# Patient Record
Sex: Female | Born: 1935 | Race: White | Hispanic: No | Smoking: Never smoker
Health system: Southern US, Community
[De-identification: ages and names within clinical notes are randomized; demographics above are authoritative.]

## PROBLEM LIST (undated history)

## (undated) DIAGNOSIS — I959 Hypotension, unspecified: Secondary | ICD-10-CM

## (undated) DIAGNOSIS — F0280 Dementia in other diseases classified elsewhere without behavioral disturbance: Secondary | ICD-10-CM

## (undated) DIAGNOSIS — K259 Gastric ulcer, unspecified as acute or chronic, without hemorrhage or perforation: Secondary | ICD-10-CM

## (undated) DIAGNOSIS — K5900 Constipation, unspecified: Secondary | ICD-10-CM

## (undated) DIAGNOSIS — M810 Age-related osteoporosis without current pathological fracture: Secondary | ICD-10-CM

## (undated) DIAGNOSIS — G309 Alzheimer's disease, unspecified: Secondary | ICD-10-CM

## (undated) DIAGNOSIS — K644 Residual hemorrhoidal skin tags: Secondary | ICD-10-CM

## (undated) DIAGNOSIS — M316 Other giant cell arteritis: Secondary | ICD-10-CM

## (undated) HISTORY — DX: Other giant cell arteritis: M31.6

## (undated) HISTORY — DX: Dementia in other diseases classified elsewhere, unspecified severity, without behavioral disturbance, psychotic disturbance, mood disturbance, and anxiety: F02.80

## (undated) HISTORY — DX: Hypotension, unspecified: I95.9

## (undated) HISTORY — DX: Age-related osteoporosis without current pathological fracture: M81.0

## (undated) HISTORY — DX: Residual hemorrhoidal skin tags: K64.4

## (undated) HISTORY — DX: Gastric ulcer, unspecified as acute or chronic, without hemorrhage or perforation: K25.9

## (undated) HISTORY — DX: Constipation, unspecified: K59.00

## (undated) HISTORY — DX: Alzheimer's disease, unspecified: G30.9

---

## 1999-01-21 ENCOUNTER — Encounter (INDEPENDENT_AMBULATORY_CARE_PROVIDER_SITE_OTHER): Payer: Self-pay | Admitting: *Deleted

## 1999-01-21 ENCOUNTER — Ambulatory Visit (HOSPITAL_COMMUNITY): Admission: RE | Admit: 1999-01-21 | Discharge: 1999-01-21 | Payer: Self-pay | Admitting: Gastroenterology

## 2001-10-04 ENCOUNTER — Ambulatory Visit (HOSPITAL_COMMUNITY): Admission: RE | Admit: 2001-10-04 | Discharge: 2001-10-04 | Payer: Self-pay | Admitting: Emergency Medicine

## 2002-06-07 ENCOUNTER — Ambulatory Visit (HOSPITAL_COMMUNITY): Admission: RE | Admit: 2002-06-07 | Discharge: 2002-06-07 | Payer: Self-pay

## 2004-03-05 ENCOUNTER — Encounter: Admission: RE | Admit: 2004-03-05 | Discharge: 2004-03-05 | Payer: Self-pay | Admitting: Gastroenterology

## 2005-02-17 ENCOUNTER — Encounter: Admission: RE | Admit: 2005-02-17 | Discharge: 2005-02-17 | Payer: Self-pay | Admitting: Emergency Medicine

## 2005-02-24 ENCOUNTER — Encounter: Admission: RE | Admit: 2005-02-24 | Discharge: 2005-02-24 | Payer: Self-pay | Admitting: Emergency Medicine

## 2005-05-21 ENCOUNTER — Encounter: Payer: Self-pay | Admitting: *Deleted

## 2005-06-24 ENCOUNTER — Encounter: Payer: Self-pay | Admitting: Vascular Surgery

## 2005-06-24 ENCOUNTER — Ambulatory Visit (HOSPITAL_COMMUNITY): Admission: RE | Admit: 2005-06-24 | Discharge: 2005-06-24 | Payer: Self-pay | Admitting: Emergency Medicine

## 2006-02-23 ENCOUNTER — Emergency Department (HOSPITAL_COMMUNITY): Admission: EM | Admit: 2006-02-23 | Discharge: 2006-02-23 | Payer: Self-pay | Admitting: Emergency Medicine

## 2006-03-15 ENCOUNTER — Encounter: Admission: RE | Admit: 2006-03-15 | Discharge: 2006-04-10 | Payer: Self-pay | Admitting: Psychiatry

## 2006-04-11 ENCOUNTER — Encounter: Admission: RE | Admit: 2006-04-11 | Discharge: 2006-05-12 | Payer: Self-pay | Admitting: Psychiatry

## 2006-05-16 ENCOUNTER — Encounter: Admission: RE | Admit: 2006-05-16 | Discharge: 2006-08-14 | Payer: Self-pay | Admitting: Psychiatry

## 2008-11-06 ENCOUNTER — Encounter: Payer: Self-pay | Admitting: Emergency Medicine

## 2008-11-07 ENCOUNTER — Inpatient Hospital Stay (HOSPITAL_COMMUNITY): Admission: RE | Admit: 2008-11-07 | Discharge: 2008-11-09 | Payer: Self-pay | Admitting: Orthopedic Surgery

## 2010-03-07 ENCOUNTER — Encounter: Payer: Self-pay | Admitting: Gastroenterology

## 2010-05-22 LAB — BASIC METABOLIC PANEL
BUN: 17 mg/dL (ref 6–23)
BUN: 19 mg/dL (ref 6–23)
CO2: 25 mEq/L (ref 19–32)
Chloride: 101 mEq/L (ref 96–112)
Chloride: 103 mEq/L (ref 96–112)
Creatinine, Ser: 0.73 mg/dL (ref 0.4–1.2)
GFR calc non Af Amer: >60 mL/min (ref >60)
Glucose, Bld: 144 mg/dL — ABNORMAL HIGH (ref 70–99)
Potassium: 3.7 mEq/L (ref 3.5–5.1)
Potassium: 3.9 mEq/L (ref 3.5–5.1)
Sodium: 139 mEq/L (ref 135–145)

## 2010-05-22 LAB — CBC
HCT: 27.5 % — ABNORMAL LOW (ref 36.0–46.0)
HCT: 28.2 % — ABNORMAL LOW (ref 36.0–46.0)
Hemoglobin: 9.4 g/dL — ABNORMAL LOW (ref 12.0–15.0)
Hemoglobin: 9.7 g/dL — ABNORMAL LOW (ref 12.0–15.0)
MCHC: 34.5 g/dL (ref 30.0–36.0)
MCV: 93 fL (ref 78.0–100.0)
MCV: 91.8 fL (ref 78.0–100.0)
Platelets: 129 10*3/uL — ABNORMAL LOW (ref 150–400)
Platelets: 138 10*3/uL — ABNORMAL LOW (ref 150–400)
Platelets: 176 10*3/uL (ref 150–400)
RDW: 13.1 % (ref 11.5–15.5)
WBC: 7.7 10*3/uL (ref 4.0–10.5)
WBC: 7.7 10*3/uL (ref 4.0–10.5)
WBC: 9.1 10*3/uL (ref 4.0–10.5)

## 2010-05-22 LAB — URINALYSIS, ROUTINE W REFLEX MICROSCOPIC
Glucose, UA: NEGATIVE mg/dL
Hgb urine dipstick: NEGATIVE
Ketones, ur: NEGATIVE mg/dL
Protein, ur: NEGATIVE mg/dL

## 2010-05-22 LAB — URINE MICROSCOPIC-ADD ON

## 2010-05-22 LAB — PROTIME-INR: Prothrombin Time: 13.9 seconds (ref 11.6–15.2)

## 2010-07-03 NOTE — Op Note (Signed)
   Norma Daugherty, Norma Daugherty                      ACCOUNT NO.:  [id number]   MEDICAL RECORD NO.:  [id number]                   PATIENT TYPE:  AMB   LOCATION:  ENDO                                 FACILITY:  [facility]   PHYSICIAN:  James L. Malon Kindle., M.D.          DATE OF BIRTH:  Jan 16, 1926   DATE OF PROCEDURE:  06/07/2002  DATE OF DISCHARGE:                                 OPERATIVE REPORT   PROCEDURE:  Esophagogastroduodenoscopy and biopsy.   MEDICATIONS:  Cetacaine spray, fentanyl 90 mcg, Versed 9 mg IV.   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   DESCRIPTION OF PROCEDURE:  The procedure had been explained to the patient  and consent obtained.  With the patient in the left lateral decubitus  position, the Olympus scope was inserted and advanced under direct  visualization.  Stomach entered, pylorus identified and passed.  The  duodenum, including the bulb and second portion, were seen well and were  unremarkable.  The scope was withdrawn back into the stomach.  No polyps  were seen in the duodenum.  The pyloric channel and the antrum were normal  other than streaky gastritis.  This was biopsied for rapid urease test for  Helicobacter.  No ulcerations were seen.  Fundus and cardia seen in the  retroflex view.  There was a small hiatal hernia.  The distal and proximal  esophagus were endoscopically normal.  The scope withdrawn.  The patient  tolerated the procedure well.   ASSESSMENT:  Mild gastritis, otherwise no abnormalities.  No polyps seen in  the duodenum.   PLAN:  Will proceed with colonoscopy at this time.                                               James L. Malon Kindle., M.D.    Waldron Session  D:  06/07/2002  T:  06/07/2002  Job:  [id number]   cc:   Oley Balm. Georgina Pillion, M.D.  [address]  [place]  [place] [address]  Fax: [phone]

## 2010-07-03 NOTE — Op Note (Signed)
[place]  Patient:    Norma Daugherty                    MRN: [id number] Proc. Date: 01/21/99 Adm. Date:  [id number] Attending:  Orland Mustard CC:         Oley Balm. Georgina Pillion, M.D.                           Operative Report  PROCEDURE:  Esophagogastroduodenoscopy, polypectomy and injection of polypectomy site.  MEDICATIONS: 1. Hurricane spray. 2. Fentanyl 100 mcg IV. 3. Versed 10 mg IV.  INDICATION:  Heme-positive stool.  DESCRIPTION OF PROCEDURE:  The procedure being explained to patient, consent obtained.  With the patient in the left lateral decubitus position, the Olympus  video endoscope was inserted blindly into the esophagus, advanced under direct visualization.  The stomach was entered, pylorus was identified on the pass. The duodenum was entered; immediately upon entering the duodenum there was a moderate amount of bright red blood.  There is no alteration seen in the bulb.  The blood seemed to be coming from the second portion.  We advanced the scope down into the second portion and a massive polyp (which was quite soft) was seen essentially filling up the lumen.  It was elongated and was ulcerated and actively bleeding.  I grabbed the polyp with a snare and pulled it up. This was quite soft, had a pillow sound like a lipoma.  The ulcerations on the head of the polyp and along the body were dripping blood.  The area was vigorously irrigated and at this point I considered, given the massive size of the polyp, backing off and having her go o surgery for removal.  Since it was actively bleeding, I felt something needed to be done.  After examining the stalk it appeared that the stalk was small, came up nto the duodenal bulb.  The whole area was irrigated and there was still bleeding seen at the tip of the polyp.  At this point I made the decision to go ahead and attempt to remove this endoscopically.  If there was  bleeding or failure to remove it endoscopically then surgery would be necessary.  But, it was possible that we could remove it adequately endoscopically.  The base of the polyp was injected with 1 cc of 1:10,000 epinephrine, with control of bleeding.  The giant snare was used and after some time we were finally able to get it around the head of polyp and pulled back to the base.  It was transected  very easily.  There was no bleeding seen at the transection site.  The polyp fortunately went distal down into the duodenum beyond the extent of the endoscope. At this point I removed the endoscope.  We reinserted a pediatric colonoscope, ent back to approximately 60 to 80 cm and found the polyp down in the third and fourth duodenum.  The snare was placed around it and we retrieved the polyp, pulled it  back into the stomach, readjusted the snare and pulled it out the mouth.  There was no bleeding at the polypectomy site.  The patient tolerated this reasonably well and was maintained on low-flow oxygen and pulse oximetry throughout the procedure, with no obvious problem.  ASSESSMENT: 1. 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 2. Bleeding controlled with epinephrine injection.  PLAN:  Will check pathology and proceed with colonoscopy at  this time, as mentioned.DD:  01/21/99 TD:  01/22/99 Job: [id number]

## 2010-07-03 NOTE — Procedures (Signed)
. [facility]  Patient:    Norma Daugherty                    MRN: [id number] Proc. Date: 01/21/99 Adm. Date:  [id number] Attending:  Orland Mustard CC:         Oley Balm. Georgina Pillion, M.D.                           Procedure Report  PROCEDURE PERFORMED:  Colonoscopy with polypectomy.  ENDOSCOPIST:  Llana Aliment. Randa Evens, M.D.  MEDICATIONS USED:  Fentanyl 100 mcg, Versed 12 mg IV.  SCOPE:  Pediatric video colonoscope.  INDICATIONS FOR PROCEDURE:  Heme positive stool.  DESCRIPTION OF PROCEDURE:  The procedure had been explained to the patient and consent obtained.  This was done following the endoscopy and removal of large lipoma from the duodenum.  The pediatric colonoscope inserted and advanced under direct visualization.  The prep was excellent.  We were able to reach the cecum  without difficulty.  The ileocecal valve was seen and entered for a short distance. The scope withdrawn, cecum, ascending colon, hepatic flexure, transverse colon ere seen well.  Two small polyps at the splenic flexure cauterized.  The descending and sigmoid colon were unremarkable.  A small rectal polyp removed.  These were placed in separate jars.  The scope was withdrawn.  The patient tolerated the procedure well.  ASSESSMENT:  Multiple colon polyps removed.  PLAN:  Will recommend repeating procedure in two years. DD:  01/21/99 TD:  01/22/99 Job: [id number]

## 2010-07-03 NOTE — Op Note (Signed)
   Norma Daugherty, Norma Daugherty                      ACCOUNT NO.:  [id number]   MEDICAL RECORD NO.:  [id number]                   PATIENT TYPE:  AMB   LOCATION:  ENDO                                 FACILITY:  [facility]   PHYSICIAN:  James L. Malon Kindle., M.D.          DATE OF BIRTH:  June 13, 1925   DATE OF PROCEDURE:  06/07/2002  DATE OF DISCHARGE:                                 OPERATIVE REPORT   PROCEDURE:  Colonoscopy.   MEDICATIONS:  __________ 100 mg, Versed 10 mg IV.   ENDOSCOPE:  Olympus pediatric colonoscope.   INDICATIONS:  Patient with a previous history of polyps.  This is done as a  three-year follow-up.   DESCRIPTION OF PROCEDURE:  The procedure had been explained to the patient  and consent obtained.  With the patient in the left lateral decubitus  position, following the endoscopy the patient was turned around and the  pediatric scope was inserted and advanced under direct visualization.  Prep  excellent, cecum reached.  The ileocecal valve and appendiceal orifice seen.  Scope withdrawn.  Cecum, ascending colon, transverse colon, descending and  sigmoid colon seen well and no significant diverticular disease, no further  polyps seen.  Rectum free of polyps.  Scope withdrawn.  The patient  tolerated the procedure well.   ASSESSMENT:  No evidence of further polyps.   PLAN:  Will recommend repeating in five years.                                               James L. Malon Kindle., M.D.    Norma Daugherty  D:  06/07/2002  T:  06/07/2002  Job:  [id number]   cc:   Oley Balm. Georgina Pillion, M.D.  [address]  [place]  [place] [address]  Fax: [phone]

## 2013-02-15 DIAGNOSIS — K644 Residual hemorrhoidal skin tags: Secondary | ICD-10-CM

## 2013-02-15 HISTORY — DX: Residual hemorrhoidal skin tags: K64.4

## 2013-05-21 ENCOUNTER — Ambulatory Visit: Payer: Medicare Other | Attending: Orthopaedic Surgery

## 2013-05-21 DIAGNOSIS — M25579 Pain in unspecified ankle and joints of unspecified foot: Secondary | ICD-10-CM | POA: Diagnosis not present

## 2013-05-21 DIAGNOSIS — IMO0001 Reserved for inherently not codable concepts without codable children: Secondary | ICD-10-CM | POA: Diagnosis present

## 2013-05-21 DIAGNOSIS — M25673 Stiffness of unspecified ankle, not elsewhere classified: Secondary | ICD-10-CM | POA: Diagnosis not present

## 2013-05-21 DIAGNOSIS — M6281 Muscle weakness (generalized): Secondary | ICD-10-CM | POA: Insufficient documentation

## 2013-05-21 DIAGNOSIS — M25676 Stiffness of unspecified foot, not elsewhere classified: Secondary | ICD-10-CM | POA: Insufficient documentation

## 2013-05-28 ENCOUNTER — Ambulatory Visit: Payer: Medicare Other | Admitting: Occupational Therapy

## 2013-05-28 DIAGNOSIS — IMO0001 Reserved for inherently not codable concepts without codable children: Secondary | ICD-10-CM | POA: Diagnosis not present

## 2013-05-30 ENCOUNTER — Ambulatory Visit: Payer: Medicare Other | Admitting: Occupational Therapy

## 2013-06-06 ENCOUNTER — Ambulatory Visit: Payer: Medicare Other | Admitting: Occupational Therapy

## 2013-06-06 DIAGNOSIS — IMO0001 Reserved for inherently not codable concepts without codable children: Secondary | ICD-10-CM | POA: Diagnosis not present

## 2013-06-08 ENCOUNTER — Ambulatory Visit: Payer: Medicare Other | Admitting: Occupational Therapy

## 2013-06-08 DIAGNOSIS — IMO0001 Reserved for inherently not codable concepts without codable children: Secondary | ICD-10-CM | POA: Diagnosis not present

## 2013-06-11 ENCOUNTER — Ambulatory Visit: Payer: Medicare Other | Admitting: Occupational Therapy

## 2013-06-11 DIAGNOSIS — IMO0001 Reserved for inherently not codable concepts without codable children: Secondary | ICD-10-CM | POA: Diagnosis not present

## 2013-06-13 ENCOUNTER — Ambulatory Visit: Payer: Medicare Other | Admitting: Occupational Therapy

## 2013-06-13 DIAGNOSIS — IMO0001 Reserved for inherently not codable concepts without codable children: Secondary | ICD-10-CM | POA: Diagnosis not present

## 2013-06-18 ENCOUNTER — Encounter: Payer: Medicare Other | Admitting: Occupational Therapy

## 2013-06-20 ENCOUNTER — Encounter: Payer: Medicare Other | Admitting: Occupational Therapy

## 2013-10-08 ENCOUNTER — Encounter (HOSPITAL_COMMUNITY): Payer: Self-pay | Admitting: Emergency Medicine

## 2013-10-08 ENCOUNTER — Emergency Department (HOSPITAL_COMMUNITY)
Admission: EM | Admit: 2013-10-08 | Discharge: 2013-10-08 | Disposition: A | Discharge status: Home / Self Care | Payer: Medicare Other | Attending: Emergency Medicine | Admitting: Emergency Medicine

## 2013-10-08 ENCOUNTER — Emergency Department (HOSPITAL_COMMUNITY): Payer: Medicare Other

## 2013-10-08 DIAGNOSIS — R404 Transient alteration of awareness: Secondary | ICD-10-CM | POA: Diagnosis present

## 2013-10-08 DIAGNOSIS — R55 Syncope and collapse: Secondary | ICD-10-CM | POA: Diagnosis not present

## 2013-10-08 DIAGNOSIS — F0280 Dementia in other diseases classified elsewhere without behavioral disturbance: Secondary | ICD-10-CM | POA: Diagnosis not present

## 2013-10-08 DIAGNOSIS — G309 Alzheimer's disease, unspecified: Secondary | ICD-10-CM | POA: Insufficient documentation

## 2013-10-08 HISTORY — DX: Dementia in other diseases classified elsewhere, unspecified severity, without behavioral disturbance, psychotic disturbance, mood disturbance, and anxiety: F02.80

## 2013-10-08 HISTORY — DX: Alzheimer's disease, unspecified: G30.9

## 2013-10-08 LAB — URINALYSIS, ROUTINE W REFLEX MICROSCOPIC
Bilirubin Urine: NEGATIVE
Glucose, UA: NEGATIVE mg/dL
Hgb urine dipstick: NEGATIVE
Ketones, ur: NEGATIVE mg/dL
Nitrite: NEGATIVE
Protein, ur: NEGATIVE mg/dL
Specific Gravity, Urine: 1.021 (ref 1.005–1.030)
Urobilinogen, UA: 1 mg/dL (ref 0.0–1.0)
pH: 6.5 (ref 5.0–8.0)

## 2013-10-08 LAB — CBC WITH DIFFERENTIAL/PLATELET
Basophils Absolute: 0 10*3/uL (ref 0.0–0.1)
Basophils Relative: 0 % (ref 0–1)
Eosinophils Absolute: 0.1 10*3/uL (ref 0.0–0.7)
Eosinophils Relative: 2 % (ref 0–5)
HCT: 37.9 % (ref 36.0–46.0)
Hemoglobin: 12.7 g/dL (ref 12.0–15.0)
Lymphocytes Relative: 20 % (ref 12–46)
Lymphs Abs: 1 10*3/uL (ref 0.7–4.0)
MCH: 31.1 pg (ref 26.0–34.0)
MCHC: 33.5 g/dL (ref 30.0–36.0)
MCV: 92.7 fL (ref 78.0–100.0)
Monocytes Absolute: 0.3 10*3/uL (ref 0.1–1.0)
Monocytes Relative: 5 % (ref 3–12)
Neutro Abs: 3.6 10*3/uL (ref 1.7–7.7)
Neutrophils Relative %: 73 % (ref 43–77)
Platelets: 123 10*3/uL — ABNORMAL LOW (ref 150–400)
RBC: 4.09 MIL/uL (ref 3.87–5.11)
RDW: 12.8 % (ref 11.5–15.5)
WBC: 5 10*3/uL (ref 4.0–10.5)

## 2013-10-08 LAB — URINE MICROSCOPIC-ADD ON

## 2013-10-08 LAB — BASIC METABOLIC PANEL
Anion gap: 14 (ref 5–15)
BUN: 16 mg/dL (ref 6–23)
CO2: 26 mEq/L (ref 19–32)
Calcium: 9.3 mg/dL (ref 8.4–10.5)
Chloride: 102 mEq/L (ref 96–112)
Creatinine, Ser: 0.69 mg/dL (ref 0.50–1.10)
GFR calc Af Amer: 89 mL/min — ABNORMAL LOW (ref >90)
GFR calc non Af Amer: 77 mL/min — ABNORMAL LOW (ref >90)
Glucose, Bld: 110 mg/dL — ABNORMAL HIGH (ref 70–99)
Potassium: 3.9 mEq/L (ref 3.7–5.3)
Sodium: 142 mEq/L (ref 137–147)

## 2013-10-08 NOTE — ED Notes (Signed)
Bed: WA21 Expected date: 10/08/13 Expected time: 12:15 PM Means of arrival: Ambulance Comments: EMS syncope

## 2013-10-08 NOTE — Discharge Instructions (Signed)
Syncope °Syncope is a medical term for fainting or passing out. This means you lose consciousness and drop to the ground. People are generally unconscious for less than 5 minutes. You may have some muscle twitches for up to 15 seconds before waking up and returning to normal. Syncope occurs more often in older adults, but it can happen to anyone. While most causes of syncope are not dangerous, syncope can be a sign of a serious medical problem. It is important to seek medical care.  °CAUSES  °Syncope is caused by a sudden drop in blood flow to the brain. The specific cause is often not determined. Factors that can bring on syncope include: °· Taking medicines that lower blood pressure. °· Sudden changes in posture, such as standing up quickly. °· Taking more medicine than prescribed. °· Standing in one place for too long. °· Seizure disorders. °· Dehydration and excessive exposure to heat. °· Low blood sugar (hypoglycemia). °· Straining to have a bowel movement. °· Heart disease, irregular heartbeat, or other circulatory problems. °· Fear, emotional distress, seeing blood, or severe pain. °SYMPTOMS  °Right before fainting, you may: °· Feel dizzy or light-headed. °· Feel nauseous. °· See all white or all black in your field of vision. °· Have cold, clammy skin. °DIAGNOSIS  °Your health care provider will ask about your symptoms, perform a physical exam, and perform an electrocardiogram (ECG) to record the electrical activity of your heart. Your health care provider may also perform other heart or blood tests to determine the cause of your syncope which may include: °· Transthoracic echocardiogram (TTE). During echocardiography, sound waves are used to evaluate how blood flows through your heart. °· Transesophageal echocardiogram (TEE). °· Cardiac monitoring. This allows your health care provider to monitor your heart rate and rhythm in real time. °· Holter monitor. This is a portable device that records your  heartbeat and can help diagnose heart arrhythmias. It allows your health care provider to track your heart activity for several days, if needed. °· Stress tests by exercise or by giving medicine that makes the heart beat faster. °TREATMENT  °In most cases, no treatment is needed. Depending on the cause of your syncope, your health care provider may recommend changing or stopping some of your medicines. °HOME CARE INSTRUCTIONS °· Have someone stay with you until you feel stable. °· Do not drive, use machinery, or play sports until your health care provider says it is okay. °· Keep all follow-up appointments as directed by your health care provider. °· Lie down right away if you start feeling like you might faint. Breathe deeply and steadily. Wait until all the symptoms have passed. °· Drink enough fluids to keep your urine clear or pale yellow. °· If you are taking blood pressure or heart medicine, get up slowly and take several minutes to sit and then stand. This can reduce dizziness. °SEEK IMMEDIATE MEDICAL CARE IF:  °· You have a severe headache. °· You have unusual pain in the chest, abdomen, or back. °· You are bleeding from your mouth or rectum, or you have black or tarry stool. °· You have an irregular or very fast heartbeat. °· You have pain with breathing. °· You have repeated fainting or seizure-like jerking during an episode. °· You faint when sitting or lying down. °· You have confusion. °· You have trouble walking. °· You have severe weakness. °· You have vision problems. °If you fainted, call your local emergency services (911 in [place] Do not drive   yourself to the hospital.  °MAKE SURE YOU: °· Understand these instructions. °· Will watch your condition. °· Will get help right away if you are not doing well or get worse. °Document Released: 02/01/2005 Document Revised: 02/06/2013 Document Reviewed: 04/02/2011 °Exit[employer]® Patient Information ©2015 [employer]. This information is not intended to replace  advice given to you by your health care provider. Make sure you discuss any questions you have with your health care provider. ° °

## 2013-10-08 NOTE — ED Notes (Signed)
Per EMS pt coming from home with a c/o witnessed syncopal episode today while sitting in a chair, was unresponsive for about 3-5 minutes, family denies seizure like activity, no fall no head injury. Pt has hx of dementia and EMS reports  Family sts pt is alert and oriented per her baseline.

## 2013-10-13 NOTE — ED Provider Notes (Signed)
CSN: [id number]     Arrival date & time 10/08/13  1226 History   First MD Initiated Contact with Patient 10/08/13 1308     Chief Complaint  Patient presents with  . Loss of Consciousness     (Consider location/radiation/quality/duration/timing/severity/associated sxs/prior Treatment) HPI  80 year old female brought in for evaluation after what appeared to be a syncopal event.   Happened shortly before arrival. Patient was sitting in a chair. She appeared to have passed out. Her eyes were closed and she was unresponsive to voice tactile stimulation. There is no incontinence. No seizure activity. She was like this for a few minutes. Back to her baseline very quickly. Patient with no acute complaints, but she does have a history of underlying dementia. No recent trauma reported. No fever. No vomiting.  Past Medical History  Diagnosis Date  . Alzheimer's dementia    History reviewed. No pertinent past surgical history. No family history on file. History  Substance Use Topics  . Smoking status: Unknown If Ever Smoked  . Smokeless tobacco: Not on file  . Alcohol Use: Not on file   OB History   Grav Para Term Preterm Abortions TAB SAB Ect Mult Living                 Review of Systems  All systems reviewed and negative, other than as noted in HPI.   Allergies  Morphine and related  Home Medications   Prior to Admission medications   Medication Sig Start Date End Date Taking? Authorizing Provider  OVER THE COUNTER MEDICATION Take 2 tablets by mouth daily. Adult Stem Cell Booster 2.1   Yes Historical Provider, MD  OVER THE COUNTER MEDICATION Take 2 capsules by mouth daily. MentaFit (Advanced Brain Nutrition)   Yes Historical Provider, MD  OVER THE COUNTER MEDICATION Take 30 mLs by mouth daily. Youngevity - Beyond Osteo-fx   Yes Historical Provider, MD  OVER THE COUNTER MEDICATION Take 3 tablets by mouth daily. ALPHA Vitamin mineral and Omega 3-6-9 Support   Yes Historical  Provider, MD  psyllium (METAMUCIL) 58.6 % powder Take 1 packet by mouth.   Yes Historical Provider, MD   BP 132/71  Pulse 88  Temp(Src) 98 F (36.7 C) (Oral)  Resp 18  SpO2 100% Physical Exam  Nursing note and vitals reviewed. Constitutional: She appears well-developed and well-nourished. No distress.  HENT:  Head: Normocephalic and atraumatic.  Right Ear: External ear normal.  Left Ear: External ear normal.  Eyes: Conjunctivae are normal. Right eye exhibits no discharge. Left eye exhibits no discharge.  Neck: Neck supple.  Cardiovascular: Normal rate, regular rhythm and normal heart sounds.  Exam reveals no gallop and no friction rub.   No murmur heard. Pulmonary/Chest: Effort normal and breath sounds normal. No respiratory distress.  Abdominal: Soft. She exhibits no distension. There is no tenderness.  Musculoskeletal: She exhibits no edema and no tenderness.  Neurological: She is alert.  Is disoriented to time. Speech is clear. Follows commands. No focal motor deficit. Cranial nerves are intact.  Skin: Skin is warm and dry. She is not diaphoretic.  Psychiatric: She has a normal mood and affect. Her behavior is normal. Thought content normal.    ED Course  Procedures (including critical care time) Labs Review Labs Reviewed  CBC WITH DIFFERENTIAL - Abnormal; Notable for the following:    Platelets 123 (*)    All other components within normal limits  BASIC METABOLIC PANEL - Abnormal; Notable for the following:  Glucose, Bld 110 (*)    GFR calc non Af Amer 77 (*)    GFR calc Af Amer 89 (*)    All other components within normal limits  URINALYSIS, ROUTINE W REFLEX MICROSCOPIC - Abnormal; Notable for the following:    APPearance CLOUDY (*)    Leukocytes, UA SMALL (*)    All other components within normal limits  URINE MICROSCOPIC-ADD ON    Imaging Review No results found.   EKG Interpretation   Date/Time:  Monday October 08 2013 12:33:51 EDT Ventricular Rate:   76 PR Interval:  164 QRS Duration: 87 QT Interval:  405 QTC Calculation: 455 R Axis:   -26 Text Interpretation:  Sinus rhythm Borderline left axis deviation Low  voltage, precordial leads ED PHYSICIAN INTERPRETATION AVAILABLE IN [facility] Confirmed by TEST, Record ([id number]) on 10/10/2013 7:18:08 AM      MDM   Final diagnoses:  Syncope and collapse    88 rolled female with syncope. Etiology not completely clear. Workup has been pretty unremarkable. Patient is back to her baseline. She is hemodynamically stable. No reported seizure activity. I feel she is stable for discharge at this time. Return cautions were discussed.    Raeford Razor, MD 10/13/13 [phone]

## 2014-03-03 ENCOUNTER — Emergency Department (HOSPITAL_COMMUNITY)
Admission: EM | Admit: 2014-03-03 | Discharge: 2014-03-03 | Disposition: A | Discharge status: Home / Self Care | Payer: Medicare Other | Attending: Emergency Medicine | Admitting: Emergency Medicine

## 2014-03-03 ENCOUNTER — Encounter (HOSPITAL_COMMUNITY): Payer: Self-pay

## 2014-03-03 DIAGNOSIS — G309 Alzheimer's disease, unspecified: Secondary | ICD-10-CM | POA: Insufficient documentation

## 2014-03-03 DIAGNOSIS — F0280 Dementia in other diseases classified elsewhere without behavioral disturbance: Secondary | ICD-10-CM | POA: Insufficient documentation

## 2014-03-03 DIAGNOSIS — R55 Syncope and collapse: Secondary | ICD-10-CM | POA: Diagnosis present

## 2014-03-03 DIAGNOSIS — Z79899 Other long term (current) drug therapy: Secondary | ICD-10-CM | POA: Insufficient documentation

## 2014-03-03 LAB — CBC WITH DIFFERENTIAL/PLATELET
BASOS ABS: 0 10*3/uL (ref 0.0–0.1)
Basophils Relative: 0 % (ref 0–1)
EOS ABS: 0.1 10*3/uL (ref 0.0–0.7)
Eosinophils Relative: 1 % (ref 0–5)
HEMATOCRIT: 40.7 % (ref 36.0–46.0)
Hemoglobin: 13.6 g/dL (ref 12.0–15.0)
LYMPHS PCT: 19 % (ref 12–46)
Lymphs Abs: 1.3 10*3/uL (ref 0.7–4.0)
MCH: 30.6 pg (ref 26.0–34.0)
MCHC: 33.4 g/dL (ref 30.0–36.0)
MCV: 91.7 fL (ref 78.0–100.0)
MONO ABS: 0.3 10*3/uL (ref 0.1–1.0)
Monocytes Relative: 5 % (ref 3–12)
Neutro Abs: 5.3 10*3/uL (ref 1.7–7.7)
Neutrophils Relative %: 75 % (ref 43–77)
PLATELETS: 132 10*3/uL — AB (ref 150–400)
RBC: 4.44 MIL/uL (ref 3.87–5.11)
RDW: 12.8 % (ref 11.5–15.5)
WBC: 7.1 10*3/uL (ref 4.0–10.5)

## 2014-03-03 LAB — URINALYSIS, ROUTINE W REFLEX MICROSCOPIC
BILIRUBIN URINE: NEGATIVE
Glucose, UA: NEGATIVE mg/dL
HGB URINE DIPSTICK: NEGATIVE
Ketones, ur: NEGATIVE mg/dL
Leukocytes, UA: NEGATIVE
Nitrite: NEGATIVE
PROTEIN: NEGATIVE mg/dL
Specific Gravity, Urine: 1.018 (ref 1.005–1.030)
Urobilinogen, UA: 0.2 mg/dL (ref 0.0–1.0)
pH: 7 (ref 5.0–8.0)

## 2014-03-03 LAB — BASIC METABOLIC PANEL
Anion gap: 6 (ref 5–15)
BUN: 18 mg/dL (ref 6–23)
CALCIUM: 9.7 mg/dL (ref 8.4–10.5)
CHLORIDE: 104 meq/L (ref 96–112)
CO2: 28 mmol/L (ref 19–32)
Creatinine, Ser: 0.64 mg/dL (ref 0.50–1.10)
GFR calc Af Amer: >90 mL/min (ref >90)
GFR calc non Af Amer: 79 mL/min — ABNORMAL LOW (ref >90)
GLUCOSE: 101 mg/dL — AB (ref 70–99)
Potassium: 4.5 mmol/L (ref 3.5–5.1)
Sodium: 138 mmol/L (ref 135–145)

## 2014-03-03 LAB — TROPONIN I: Troponin I: <0.03 ng/mL (ref <0.031)

## 2014-03-03 NOTE — ED Notes (Signed)
PT family unplugged vital machine and placed pt in clothes. PT family assisted pt to bedside chair. This nurse advised for her safety it would be better if pt in bed. Family states pt is ready to go and would like to sit in bedside chair.

## 2014-03-03 NOTE — Discharge Instructions (Signed)
Syncope °Syncope is a medical term for fainting or passing out. This means you lose consciousness and drop to the ground. People are generally unconscious for less than 5 minutes. You may have some muscle twitches for up to 15 seconds before waking up and returning to normal. Syncope occurs more often in older adults, but it can happen to anyone. While most causes of syncope are not dangerous, syncope can be a sign of a serious medical problem. It is important to seek medical care.  °CAUSES  °Syncope is caused by a sudden drop in blood flow to the brain. The specific cause is often not determined. Factors that can bring on syncope include: °· Taking medicines that lower blood pressure. °· Sudden changes in posture, such as standing up quickly. °· Taking more medicine than prescribed. °· Standing in one place for too long. °· Seizure disorders. °· Dehydration and excessive exposure to heat. °· Low blood sugar (hypoglycemia). °· Straining to have a bowel movement. °· Heart disease, irregular heartbeat, or other circulatory problems. °· Fear, emotional distress, seeing blood, or severe pain. °SYMPTOMS  °Right before fainting, you may: °· Feel dizzy or light-headed. °· Feel nauseous. °· See all white or all black in your field of vision. °· Have cold, clammy skin. °DIAGNOSIS  °Your health care provider will ask about your symptoms, perform a physical exam, and perform an electrocardiogram (ECG) to record the electrical activity of your heart. Your health care provider may also perform other heart or blood tests to determine the cause of your syncope which may include: °· Transthoracic echocardiogram (TTE). During echocardiography, sound waves are used to evaluate how blood flows through your heart. °· Transesophageal echocardiogram (TEE). °· Cardiac monitoring. This allows your health care provider to monitor your heart rate and rhythm in real time. °· Holter monitor. This is a portable device that records your  heartbeat and can help diagnose heart arrhythmias. It allows your health care provider to track your heart activity for several days, if needed. °· Stress tests by exercise or by giving medicine that makes the heart beat faster. °TREATMENT  °In most cases, no treatment is needed. Depending on the cause of your syncope, your health care provider may recommend changing or stopping some of your medicines. °HOME CARE INSTRUCTIONS °· Have someone stay with you until you feel stable. °· Do not drive, use machinery, or play sports until your health care provider says it is okay. °· Keep all follow-up appointments as directed by your health care provider. °· Lie down right away if you start feeling like you might faint. Breathe deeply and steadily. Wait until all the symptoms have passed. °· Drink enough fluids to keep your urine clear or pale yellow. °· If you are taking blood pressure or heart medicine, get up slowly and take several minutes to sit and then stand. This can reduce dizziness. °SEEK IMMEDIATE MEDICAL CARE IF:  °· You have a severe headache. °· You have unusual pain in the chest, abdomen, or back. °· You are bleeding from your mouth or rectum, or you have black or tarry stool. °· You have an irregular or very fast heartbeat. °· You have pain with breathing. °· You have repeated fainting or seizure-like jerking during an episode. °· You faint when sitting or lying down. °· You have confusion. °· You have trouble walking. °· You have severe weakness. °· You have vision problems. °If you fainted, call your local emergency services (911 in [place] Do not drive   yourself to the hospital.  °MAKE SURE YOU: °· Understand these instructions. °· Will watch your condition. °· Will get help right away if you are not doing well or get worse. °Document Released: 02/01/2005 Document Revised: 02/06/2013 Document Reviewed: 04/02/2011 °Exit[employer]® Patient Information ©2015 [employer]. This information is not intended to replace  advice given to you by your health care provider. Make sure you discuss any questions you have with your health care provider. ° °

## 2014-03-03 NOTE — ED Provider Notes (Signed)
CSN: [id number]     Arrival date & time 03/03/14  1059 History   First MD Initiated Contact with Patient 03/03/14 1118     Chief Complaint  Patient presents with  . Loss of Consciousness     (Consider location/radiation/quality/duration/timing/severity/associated sxs/prior Treatment) HPI Comments: Level V Caveat due to dementia.  Patient is a 81 y.o. female presenting with syncope.  Loss of Consciousness Episode history:  Single Most recent episode:  Today Duration:  5 minutes Timing:  Constant Progression:  Resolved Chronicity:  Recurrent (happened 5 montyhs ago) Context comment:  Occurred while sitting in the kitchen Witnessed: yes   Relieved by:  Lying down Associated symptoms comment:  Unable to specify due to dementia. Witnesses denied any abnormal symptoms.   Past Medical History  Diagnosis Date  . Alzheimer's dementia    History reviewed. No pertinent past surgical history. No family history on file. History  Substance Use Topics  . Smoking status: Unknown If Ever Smoked  . Smokeless tobacco: Not on file  . Alcohol Use: Not on file   OB History    No data available     Review of Systems  Unable to perform ROS: Dementia  Cardiovascular: Positive for syncope.      Allergies  Morphine and related  Home Medications   Prior to Admission medications   Medication Sig Start Date End Date Taking? Authorizing Provider  OVER THE COUNTER MEDICATION Take 2 tablets by mouth daily. Adult Stem Cell Booster 2.1   Yes Historical Provider, MD  OVER THE COUNTER MEDICATION Take 2 capsules by mouth daily. MentaFit (Advanced Brain Nutrition)   Yes Historical Provider, MD  OVER THE COUNTER MEDICATION Take 30 mLs by mouth daily. Youngevity - Beyond Osteo-fx   Yes Historical Provider, MD  OVER THE COUNTER MEDICATION Take 3 tablets by mouth daily. ALPHA Vitamin mineral and Omega 3-6-9 Support   Yes Historical Provider, MD  OVER THE COUNTER MEDICATION Take 2 tablets by mouth  daily. Super [employer]'s Mane tabs   Yes Historical Provider, MD  OVER THE COUNTER MEDICATION Take 2 tablets by mouth daily. COLODETOX TABS   Yes Historical Provider, MD  OVER THE COUNTER MEDICATION Take 1 mL by mouth daily. MINERAL SUPPORT LIQUID   Yes Historical Provider, MD  OVER THE COUNTER MEDICATION Take 1 mL by mouth daily. VITAMIN SUPPORT   Yes Historical Provider, MD  psyllium (METAMUCIL) 58.6 % powder Take 1 packet by mouth daily.    Yes Historical Provider, MD   BP 121/56 mmHg  Pulse 66  Resp 14  Ht  (1.651 m)  Wt 130 lb (58.968 kg)  BMI 21.63 kg/m2  SpO2 98% Physical Exam  Constitutional: She appears well-developed and well-nourished. No distress.  HENT:  Head: Normocephalic and atraumatic.  Mouth/Throat: Oropharynx is clear and moist.  Eyes: Conjunctivae are normal. Pupils are equal, round, and reactive to light. No scleral icterus.  Neck: Neck supple.  Cardiovascular: Normal rate, regular rhythm, normal heart sounds and intact distal pulses.   No murmur heard. Pulmonary/Chest: Effort normal and breath sounds normal. No stridor. No respiratory distress. She has no rales.  Abdominal: Soft. Bowel sounds are normal. She exhibits no distension. There is no tenderness.  Musculoskeletal: Normal range of motion.  Neurological: She is alert. She has normal strength. She is disoriented.  Pleasant disposition  Skin: Skin is warm and dry. No rash noted.  Psychiatric: She has a normal mood and affect. Her behavior is normal.  Nursing note and vitals  reviewed.   ED Course  Procedures (including critical care time) Labs Review Labs Reviewed  CBC WITH DIFFERENTIAL - Abnormal; Notable for the following:    Platelets 132 (*)    All other components within normal limits  BASIC METABOLIC PANEL - Abnormal; Notable for the following:    Glucose, Bld 101 (*)    GFR calc non Af Amer 79 (*)    All other components within normal limits  TROPONIN I  URINALYSIS, ROUTINE W REFLEX  MICROSCOPIC    Imaging Review No results found.   EKG Interpretation None      EKG - sinus rhythm, rate 68, leftward axis, normal intervals, no ST/T changes, similar to prior.   MDM   Final diagnoses:  Syncope, unspecified syncope type    81 yo female with a history of dementia who presents after an apparent episode of syncope.  She slumped over on her stool, but did not fall or injure herself.  Her EKG was unchanged and labs were reassuring.  I had a discussion about admission with her family, but they preferred to go home and follow up outpatient.      Candyce ChurnJohn David Glenice Ciccone III, MD 03/03/14 [phone]

## 2014-03-03 NOTE — ED Notes (Signed)
Phlebotomy at bedside,

## 2014-03-03 NOTE — ED Notes (Signed)
Pt with hx of advanced Alzheimer's.  Pt was sitting and eating breakfast.  Pt's granddaughter was with her and pt slumped over in the chair.  Pt did not fall.  Granddaughter assisted pt to the floor.  Family states pt was difficult to arouse and lethargic for 10 minutes.  Upon EMS arrival, pt was alert and disoriented (which we believe is her baseline).  Pt CBG 119.

## 2014-12-24 ENCOUNTER — Inpatient Hospital Stay (HOSPITAL_COMMUNITY)
Admission: EM | Admit: 2014-12-24 | Discharge: 2014-12-27 | DRG: 378 | Disposition: A | Discharge status: Home / Self Care | Payer: Medicare Other | Attending: Internal Medicine | Admitting: Internal Medicine

## 2014-12-24 ENCOUNTER — Inpatient Hospital Stay (HOSPITAL_COMMUNITY): Payer: Medicare Other

## 2014-12-24 DIAGNOSIS — K922 Gastrointestinal hemorrhage, unspecified: Secondary | ICD-10-CM | POA: Diagnosis not present

## 2014-12-24 DIAGNOSIS — K254 Chronic or unspecified gastric ulcer with hemorrhage: Secondary | ICD-10-CM | POA: Diagnosis present

## 2014-12-24 DIAGNOSIS — T39395A Adverse effect of other nonsteroidal anti-inflammatory drugs [NSAID], initial encounter: Secondary | ICD-10-CM | POA: Diagnosis present

## 2014-12-24 DIAGNOSIS — K449 Diaphragmatic hernia without obstruction or gangrene: Secondary | ICD-10-CM | POA: Diagnosis present

## 2014-12-24 DIAGNOSIS — D649 Anemia, unspecified: Secondary | ICD-10-CM | POA: Diagnosis not present

## 2014-12-24 DIAGNOSIS — F0280 Dementia in other diseases classified elsewhere without behavioral disturbance: Secondary | ICD-10-CM | POA: Diagnosis present

## 2014-12-24 DIAGNOSIS — Z66 Do not resuscitate: Secondary | ICD-10-CM | POA: Diagnosis not present

## 2014-12-24 DIAGNOSIS — K222 Esophageal obstruction: Secondary | ICD-10-CM | POA: Diagnosis present

## 2014-12-24 DIAGNOSIS — K2970 Gastritis, unspecified, without bleeding: Secondary | ICD-10-CM | POA: Diagnosis present

## 2014-12-24 DIAGNOSIS — I9589 Other hypotension: Secondary | ICD-10-CM | POA: Diagnosis present

## 2014-12-24 DIAGNOSIS — Y929 Unspecified place or not applicable: Secondary | ICD-10-CM | POA: Diagnosis not present

## 2014-12-24 DIAGNOSIS — R579 Shock, unspecified: Secondary | ICD-10-CM | POA: Diagnosis present

## 2014-12-24 DIAGNOSIS — G309 Alzheimer's disease, unspecified: Secondary | ICD-10-CM

## 2014-12-24 DIAGNOSIS — Z23 Encounter for immunization: Secondary | ICD-10-CM

## 2014-12-24 DIAGNOSIS — I959 Hypotension, unspecified: Secondary | ICD-10-CM

## 2014-12-24 DIAGNOSIS — D62 Acute posthemorrhagic anemia: Secondary | ICD-10-CM | POA: Diagnosis present

## 2014-12-24 LAB — COMPREHENSIVE METABOLIC PANEL
ALT: 13 U/L — AB (ref 14–54)
AST: 17 U/L (ref 15–41)
Albumin: 2.8 g/dL — ABNORMAL LOW (ref 3.5–5.0)
Alkaline Phosphatase: 52 U/L (ref 38–126)
Anion gap: 9 (ref 5–15)
BUN: 54 mg/dL — AB (ref 6–20)
CHLORIDE: 107 mmol/L (ref 101–111)
CO2: 24 mmol/L (ref 22–32)
CREATININE: 0.69 mg/dL (ref 0.44–1.00)
Calcium: 9.2 mg/dL (ref 8.9–10.3)
GFR calc Af Amer: >60 mL/min (ref >60)
Glucose, Bld: 137 mg/dL — ABNORMAL HIGH (ref 65–99)
Potassium: 4 mmol/L (ref 3.5–5.1)
Sodium: 140 mmol/L (ref 135–145)
Total Bilirubin: 0.7 mg/dL (ref 0.3–1.2)
Total Protein: 5.7 g/dL — ABNORMAL LOW (ref 6.5–8.1)

## 2014-12-24 LAB — CBC WITH DIFFERENTIAL/PLATELET
Basophils Absolute: 0 10*3/uL (ref 0.0–0.1)
Basophils Relative: 0 %
EOS PCT: 1 %
Eosinophils Absolute: 0.1 10*3/uL (ref 0.0–0.7)
HCT: 32.6 % — ABNORMAL LOW (ref 36.0–46.0)
Hemoglobin: 10.7 g/dL — ABNORMAL LOW (ref 12.0–15.0)
LYMPHS ABS: 2 10*3/uL (ref 0.7–4.0)
Lymphocytes Relative: 32 %
MCH: 30.7 pg (ref 26.0–34.0)
MCHC: 32.8 g/dL (ref 30.0–36.0)
MCV: 93.4 fL (ref 78.0–100.0)
MONO ABS: 0.2 10*3/uL (ref 0.1–1.0)
Monocytes Relative: 3 %
Neutro Abs: 4 10*3/uL (ref 1.7–7.7)
Neutrophils Relative %: 64 %
PLATELETS: 157 10*3/uL (ref 150–400)
RBC: 3.49 MIL/uL — ABNORMAL LOW (ref 3.87–5.11)
RDW: 12.5 % (ref 11.5–15.5)
WBC: 6.3 10*3/uL (ref 4.0–10.5)

## 2014-12-24 LAB — I-STAT CHEM 8, ED
BUN: 57 mg/dL — ABNORMAL HIGH (ref 6–20)
Calcium, Ion: 1.28 mmol/L (ref 1.13–1.30)
Chloride: 104 mmol/L (ref 101–111)
Creatinine, Ser: 0.7 mg/dL (ref 0.44–1.00)
Glucose, Bld: 126 mg/dL — ABNORMAL HIGH (ref 65–99)
HEMATOCRIT: 31 % — AB (ref 36.0–46.0)
HEMOGLOBIN: 10.5 g/dL — AB (ref 12.0–15.0)
Potassium: 4 mmol/L (ref 3.5–5.1)
Sodium: 141 mmol/L (ref 135–145)
TCO2: 24 mmol/L (ref 0–100)

## 2014-12-24 LAB — HEPATIC FUNCTION PANEL
ALBUMIN: 2.8 g/dL — AB (ref 3.5–5.0)
ALT: 12 U/L — ABNORMAL LOW (ref 14–54)
AST: 15 U/L (ref 15–41)
Alkaline Phosphatase: 49 U/L (ref 38–126)
Bilirubin, Direct: <0.1 mg/dL — ABNORMAL LOW (ref 0.1–0.5)
TOTAL PROTEIN: 5.6 g/dL — AB (ref 6.5–8.1)
Total Bilirubin: 0.6 mg/dL (ref 0.3–1.2)

## 2014-12-24 LAB — CBC
HEMATOCRIT: 28.3 % — AB (ref 36.0–46.0)
HEMOGLOBIN: 9.4 g/dL — AB (ref 12.0–15.0)
MCH: 30.5 pg (ref 26.0–34.0)
MCHC: 33.2 g/dL (ref 30.0–36.0)
MCV: 91.9 fL (ref 78.0–100.0)
Platelets: 130 10*3/uL — ABNORMAL LOW (ref 150–400)
RBC: 3.08 MIL/uL — ABNORMAL LOW (ref 3.87–5.11)
RDW: 12.4 % (ref 11.5–15.5)
WBC: 7.9 10*3/uL (ref 4.0–10.5)

## 2014-12-24 LAB — PREPARE RBC (CROSSMATCH)

## 2014-12-24 LAB — SAMPLE TO BLOOD BANK

## 2014-12-24 LAB — PROTIME-INR
INR: 1.18 (ref 0.00–1.49)
Prothrombin Time: 15.2 seconds (ref 11.6–15.2)

## 2014-12-24 LAB — ABO/RH: ABO/RH(D): A POS

## 2014-12-24 LAB — TROPONIN I

## 2014-12-24 LAB — POC OCCULT BLOOD, ED: Fecal Occult Bld: POSITIVE — AB

## 2014-12-24 LAB — MRSA PCR SCREENING: MRSA by PCR: NEGATIVE

## 2014-12-24 LAB — MAGNESIUM: Magnesium: 2 mg/dL (ref 1.7–2.4)

## 2014-12-24 MED ORDER — ACETAMINOPHEN 325 MG PO TABS: 650.0000 mg | ORAL_TABLET | Freq: Four times a day (QID) | ORAL | Status: DC | PRN

## 2014-12-24 MED ORDER — PANTOPRAZOLE SODIUM 40 MG IV SOLR
8.0000 mg/h | INTRAVENOUS | Status: DC
  Administered 2014-12-24 – 2014-12-25 (×2): 8 mg/h via INTRAVENOUS
  Filled 2014-12-24 (×9): qty 80

## 2014-12-24 MED ORDER — SODIUM CHLORIDE 0.9 % IV BOLUS (SEPSIS)
500.0000 mL | Freq: Once | INTRAVENOUS | Status: AC
  Administered 2014-12-24: 500 mL via INTRAVENOUS

## 2014-12-24 MED ORDER — ACETAMINOPHEN 650 MG RE SUPP: 650.0000 mg | Freq: Four times a day (QID) | RECTAL | Status: DC | PRN

## 2014-12-24 MED ORDER — PANTOPRAZOLE SODIUM 40 MG IV SOLR: 40.0000 mg | Freq: Two times a day (BID) | INTRAVENOUS | Status: DC

## 2014-12-24 MED ORDER — SODIUM CHLORIDE 0.9 % IV SOLN
INTRAVENOUS | Status: DC
  Administered 2014-12-24 – 2014-12-27 (×4): via INTRAVENOUS

## 2014-12-24 MED ORDER — OXYCODONE HCL 5 MG PO TABS
5.0000 mg | ORAL_TABLET | ORAL | Status: DC | PRN
  Administered 2014-12-25: 5 mg via ORAL
  Filled 2014-12-24: qty 1

## 2014-12-24 MED ORDER — SODIUM CHLORIDE 0.9 % IV SOLN
80.0000 mg | Freq: Once | INTRAVENOUS | Status: AC
  Administered 2014-12-24: 80 mg via INTRAVENOUS
  Filled 2014-12-24: qty 80

## 2014-12-24 MED ORDER — SODIUM CHLORIDE 0.9 % IV SOLN: INTRAVENOUS | Status: AC

## 2014-12-24 MED ORDER — PANTOPRAZOLE SODIUM 40 MG IV SOLR
40.0000 mg | Freq: Once | INTRAVENOUS | Status: AC
  Administered 2014-12-24: 40 mg via INTRAVENOUS
  Filled 2014-12-24: qty 40

## 2014-12-24 MED ORDER — SODIUM CHLORIDE 0.9 % IV SOLN: Freq: Once | INTRAVENOUS | Status: DC

## 2014-12-24 NOTE — ED Notes (Signed)
Attempted report 

## 2014-12-24 NOTE — ED Provider Notes (Signed)
CSN: [id number]     Arrival date & time 12/24/14  1433 History   First MD Initiated Contact with Patient 12/24/14 1500     Chief Complaint  Patient presents with  . Emesis  . Weakness     (Consider location/radiation/quality/duration/timing/severity/associated sxs/prior Treatment) HPI   Norma Daugherty is a 81 y.o. female who presents for evaluation of weakness which occurred while she was walking to the bathroom. She was being assisted, and was laid on the floor after she became weak. There were no injuries. There was no loss of consciousness. She's had similar episodes previously, but "nothing was ever found". There's been no recent fever, chills, nausea, vomiting, cough, shortness of breath or chest pain. She is a very poor historian and cannot give history. According to her husband and caregiver were with her. She apparently vomited during transport by EMS, and it "looked like blood".  Level V caveat- dementia  Past Medical History  Diagnosis Date  . Alzheimer's dementia    No past surgical history on file. No family history on file. Social History  Substance Use Topics  . Smoking status: Unknown If Ever Smoked  . Smokeless tobacco: Not on file  . Alcohol Use: Not on file   OB History    No data available     Review of Systems  Unable to perform ROS: Dementia      Allergies  Morphine and related  Home Medications   Prior to Admission medications   Medication Sig Start Date End Date Taking? Authorizing Provider  Misc Natural Products (NARCOSOFT HERBAL LAX PO) Take 1 tablet by mouth daily.   Yes Historical Provider, MD  naproxen sodium (ANAPROX) 220 MG tablet Take 220 mg by mouth 2 (two) times daily as needed (arthritis pain, headache).   Yes Historical Provider, MD  OVER THE COUNTER MEDICATION Take 30 mLs by mouth daily. Youngevity - Beyond Osteo-fx   Yes Historical Provider, MD  OVER THE COUNTER MEDICATION Take 3 tablets by mouth daily. ALPHA Vitamin mineral  and Omega 3-6-9 Support   Yes Historical Provider, MD  OVER THE COUNTER MEDICATION Take 1 mL by mouth daily. MINERAL SUPPORT LIQUID   Yes Historical Provider, MD  OVER THE COUNTER MEDICATION Take 1 mL by mouth daily. VITAMIN SUPPORT   Yes Historical Provider, MD  psyllium (METAMUCIL) 58.6 % powder Take 1 packet by mouth daily.    Yes Historical Provider, MD   BP 133/66 mmHg  Pulse 81  Resp 14  SpO2 98% Physical Exam  Constitutional: She is oriented to person, place, and time. She appears well-developed.  Elderly, frail  HENT:  Head: Normocephalic and atraumatic.  Right Ear: External ear normal.  Left Ear: External ear normal.  Coffee ground emesis evident on lips, and chin. Mucous members are dry.  Eyes: Conjunctivae and EOM are normal. Pupils are equal, round, and reactive to light.  Pale mucosa  Neck: Normal range of motion and phonation normal. Neck supple.  Cardiovascular: Normal rate, regular rhythm and normal heart sounds.   Pulmonary/Chest: Effort normal and breath sounds normal. She exhibits no bony tenderness.  Abdominal: Soft. There is no tenderness.  Musculoskeletal: Normal range of motion.  Neurological: She is alert and oriented to person, place, and time. No cranial nerve deficit or sensory deficit. She exhibits normal muscle tone. Coordination normal.  Skin: Skin is warm, dry and intact.  Psychiatric: Her behavior is normal.  Depressed affect  Nursing note and vitals reviewed.   ED Course  Procedures (including critical care time)  Medications  sodium chloride 0.9 % bolus 500 mL (0 mLs Intravenous Stopped 12/24/14 1552)  pantoprazole (PROTONIX) injection 40 mg (40 mg Intravenous Given 12/24/14 1550)    Patient Vitals for the past 24 hrs:  BP Pulse Resp SpO2  12/24/14 1800 133/66 mmHg 81 14 98 %  12/24/14 1745 - 79 19 100 %  12/24/14 1730 108/70 mmHg 77 15 99 %  12/24/14 1715 127/66 mmHg 79 20 100 %  12/24/14 1655 139/71 mmHg 85 - 100 %  12/24/14 1645 - 91 -  96 %  12/24/14 1630 121/80 mmHg 86 - 100 %  12/24/14 1615 (!) 122/48 mmHg 86 (!) 33 100 %  12/24/14 1600 (!) 113/46 mmHg 76 15 99 %  12/24/14 1530 (!) 105/48 mmHg 73 13 100 %  12/24/14 1515 (!) 101/50 mmHg 87 17 91 %  12/24/14 1453 (!) 79/47 mmHg 105 - 98 %    5:43 PM Reevaluation with update and discussion. After initial assessment and treatment, an updated evaluation reveals she is somewhat more alert, but continues to be confused, which is her baseline. Family members updated on findings. Lindell Tussey L   5:50 PM-Consult complete with Hospitalist. Patient case explained and discussed. She agrees to admit patient for further evaluation and treatment. Call ended at 18:30  17:55- page to gastroenterology. Dr. Christella Hartigan will see the patient for evaluation and treatment, as needed, beginning tomorrow ((1805).  CRITICAL CARE Performed by: Flint Melter Total critical care time: 50 minutes minutes Critical care time was exclusive of separately billable procedures and treating other patients. Critical care was necessary to treat or prevent imminent or life-threatening deterioration. Critical care was time spent personally by me on the following activities: development of treatment plan with patient and/or surrogate as well as nursing, discussions with consultants, evaluation of patient's response to treatment, examination of patient, obtaining history from patient or surrogate, ordering and performing treatments and interventions, ordering and review of laboratory studies, ordering and review of radiographic studies, pulse oximetry and re-evaluation of patient's condition.  Labs Review Labs Reviewed  COMPREHENSIVE METABOLIC PANEL - Abnormal; Notable for the following:    Glucose, Bld 137 (*)    BUN 54 (*)    Total Protein 5.7 (*)    Albumin 2.8 (*)    ALT 13 (*)    All other components within normal limits  CBC WITH DIFFERENTIAL/PLATELET - Abnormal; Notable for the following:    RBC 3.49  (*)    Hemoglobin 10.7 (*)    HCT 32.6 (*)    All other components within normal limits  I-STAT CHEM 8, ED - Abnormal; Notable for the following:    BUN 57 (*)    Glucose, Bld 126 (*)    Hemoglobin 10.5 (*)    HCT 31.0 (*)    All other components within normal limits  POC OCCULT BLOOD, ED - Abnormal; Notable for the following:    Fecal Occult Bld POSITIVE (*)    All other components within normal limits  PROTIME-INR  SAMPLE TO BLOOD BANK    Imaging Review No results found. I have personally reviewed and evaluated these images and lab results as part of my medical decision-making.   EKG Interpretation   Date/Time:  Tuesday December 24 2014 15:29:46 EST Ventricular Rate:  75 PR Interval:  163 QRS Duration: 84 QT Interval:  399 QTC Calculation: 446 R Axis:   -29 Text Interpretation:  Sinus rhythm Borderline left axis deviation  Borderline  T abnormalities, lateral leads Since last tracing T wave  abnormality is new Confirmed by [facility] Institute[facility]ENT[facility]  MD, Raynesha Tiedt [phone]([id number]) on 12/24/2014  5:04:56 PM      MDM   Final diagnoses:  Upper GI bleeding  Anemia, unspecified anemia type  Other specified hypotension    Upper GI bleeding, with transient hypotension. Blood pressure improved with the fluid replacement. Doubt metabolic instability or impending vascular collapse.  Nursing Notes Reviewed/ Care Coordinated, and agree without changes. Applicable Imaging Reviewed.  Interpretation of Laboratory Data incorporated into ED treatment  Plan: Admit    Mancel BaleElliott Aariona Momon, MD 12/25/14 [phone]

## 2014-12-24 NOTE — H&P (Addendum)
[facility] History and Physical  Norma BrooksShirley N Dutson ZOX:[id number] DOB: Aug 07, 1925 DOA: 12/24/2014  Referring physician:   PCP: No primary care provider on file.   Chief Complaint: Unresponsiveness/bloody emesis  HPI:  81 year old female with a history of Alzheimer's dementia, does not provide any meaningful history, presents to the ER after she was noted to be weak this evening when she attempted to walk to the bathroom. Patient was laid down on the floor after she became weak. She did not fall. She did not lose consciousness. Patient was brought in by husband, on her way to the ED the patient had one episode of bloody emesis. Blood pressure was noted to be 79/47. Patient reportedly did not have any bleeding at home. As per her home medication list the patient is on Naprosyn. 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 This polyp was removed. Patient's daughter, grandson are by the bedside, who are not patient's primary caregivers, and they are not able to provide any further history.   Review of Systems  Unable to perform ROS: Dementia   Past Medical History  Diagnosis Date  . Alzheimer's dementia      No past surgical history on file.    Social History:  has no tobacco, alcohol, and drug history on file.    Allergies  Allergen Reactions  . Morphine And Related Nausea And Vomiting        FAMILY HISTORY  When questioned  Directly-patient reports  No family history of HTN, CVA ,DIABETES, TB, Cancer CAD, Bleeding Disorders, Sickle Cell, diabetes, anemia, asthma,   Prior to Admission medications   Medication Sig Start Date End Date Taking? Authorizing Provider  Misc Natural Products (NARCOSOFT HERBAL LAX PO) Take 1 tablet by mouth daily.   Yes Historical Provider, MD  naproxen sodium (ANAPROX) 220 MG tablet Take 220 mg by mouth 2 (two) times daily as needed (arthritis pain,  headache).   Yes Historical Provider, MD  OVER THE COUNTER MEDICATION Take 30 mLs by mouth daily. Youngevity - Beyond Osteo-fx   Yes Historical Provider, MD  OVER THE COUNTER MEDICATION Take 3 tablets by mouth daily. ALPHA Vitamin mineral and Omega 3-6-9 Support   Yes Historical Provider, MD  OVER THE COUNTER MEDICATION Take 1 mL by mouth daily. MINERAL SUPPORT LIQUID   Yes Historical Provider, MD  OVER THE COUNTER MEDICATION Take 1 mL by mouth daily. VITAMIN SUPPORT   Yes Historical Provider, MD  psyllium (METAMUCIL) 58.6 % powder Take 1 packet by mouth daily.    Yes Historical Provider, MD     Physical Exam: Filed Vitals:   12/24/14 1815 12/24/14 1830 12/24/14 1845 12/24/14 1930  BP: 132/74 104/72 120/45 119/66  Pulse: 78 80 80 79  Resp: 20 16  13   SpO2: 99% 96% 97% 95%     Constitutional: Vital signs reviewed., Elderly frail, UNcooperative with exam. Disoriented.  Head: Normocephalic and atraumatic  Ear: TM normal bilaterally  Coffee ground emesis evident on lips, and chin. Mucous members are dry.  Eyes: PERRL, EOMI, conjunctivae normal, No scleral icterus.  Neck: Supple, Trachea midline normal ROM, No JVD, mass, thyromegaly, or carotid bruit present.  Cardiovascular: RRR, S1 normal, S2 normal, no MRG, pulses symmetric and intact bilaterally  Pulmonary/Chest: CTAB, no wheezes, rales, or rhonchi  Abdominal: Soft. Non-tender, non-distended, bowel sounds are normal, no masses, organomegaly, or guarding present.  GU: no CVA tenderness Musculoskeletal: No joint deformities, erythema, or stiffness,  ROM full and no nontender Ext: no edema and no cyanosis, pulses palpable bilaterally (DP and PT)  Hematology: no cervical, inginal, or axillary adenopathy.  Neurological: Disoriented,, does not cooperate with a full physical exam Skin: Warm, dry and intact. No rash, cyanosis, or clubbing.  Psychiatric: Normal mood and affect. speech and behavior is normal. Judgment and thought content normal.  Cognition and memory are normal.      Data Review   Micro Results No results found for this or any previous visit (from the past 240 hour(s)).  Radiology Reports No results found.   CBC  Recent Labs Lab 12/24/14 1515 12/24/14 1531  WBC 6.3  --   HGB 10.7* 10.5*  HCT 32.6* 31.0*  PLT 157  --   MCV 93.4  --   MCH 30.7  --   MCHC 32.8  --   RDW 12.5  --   LYMPHSABS 2.0  --   MONOABS 0.2  --   EOSABS 0.1  --   BASOSABS 0.0  --     Chemistries   Recent Labs Lab 12/24/14 1515 12/24/14 1531  NA 140 141  K 4.0 4.0  CL 107 104  CO2 24  --   GLUCOSE 137* 126*  BUN 54* 57*  CREATININE 0.69 0.70  CALCIUM 9.2  --   AST 17  --   ALT 13*  --   ALKPHOS 52  --   BILITOT 0.7  --    ------------------------------------------------------------------------------------------------------------------ CrCl cannot be calculated (Unknown ideal weight.). ------------------------------------------------------------------------------------------------------------------ No results for input(s): HGBA1C in the last 72 hours. ------------------------------------------------------------------------------------------------------------------ No results for input(s): CHOL, HDL, LDLCALC, TRIG, CHOLHDL, LDLDIRECT in the last 72 hours. ------------------------------------------------------------------------------------------------------------------ No results for input(s): TSH, T4TOTAL, T3FREE, THYROIDAB in the last 72 hours.  Invalid input(s): FREET3 ------------------------------------------------------------------------------------------------------------------ No results for input(s): VITAMINB12, FOLATE, FERRITIN, TIBC, IRON, RETICCTPCT in the last 72 hours.  Coagulation profile  Recent Labs Lab 12/24/14 1515  INR 1.18    No results for input(s): DDIMER in the last 72 hours.  Cardiac Enzymes No results for input(s): CKMB, TROPONINI, MYOGLOBIN in the last 168 hours.  Invalid  input(s): CK ------------------------------------------------------------------------------------------------------------------ Invalid input(s): POCBNP   CBG: No results for input(s): GLUCAP in the last 168 hours.     EKG: Independently reviewed.    Assessment/Plan Active Problems:  Probable Upper GI bleeding-probable upper GI bleeding, in the setting of NSAID use EDP has notified Dr. Christella Hartigan, Patient started on a Protonix drip Will transfuse 1 unit of packed red blood cells in the setting of hypotensive shock Admit to step down Keep patient nothing by mouth after midnight for possible EGD tomorrow.     Alzheimer's dementia-patient does not take any medications for this     Code Status:  Pending confirmation from caregiver, gran Family Communication: bedside Disposition Plan: admit   Total time spent 55 minutes.Greater than 50% of this time was spent in counseling, explanation of diagnosis, planning of further management, and coordination of care  [facility] Pager [phone]  If 7PM-7AM, please contact night-coverage [email or web address] Password [facility] 12/24/2014, 7:49 PM

## 2014-12-24 NOTE — ED Notes (Signed)
Portable Xray @ bedside

## 2014-12-24 NOTE — ED Notes (Signed)
Pt brought in by husband with episode of bloody vomit and weakness starting today.. Pt hx of alzheimer's, incomprehensible speech at baseline. BP 79/47 in triage.

## 2014-12-25 LAB — COMPREHENSIVE METABOLIC PANEL
ALBUMIN: 2.6 g/dL — AB (ref 3.5–5.0)
ALK PHOS: 44 U/L (ref 38–126)
ALT: 11 U/L — AB (ref 14–54)
AST: 15 U/L (ref 15–41)
Anion gap: 5 (ref 5–15)
BUN: 33 mg/dL — AB (ref 6–20)
CALCIUM: 8.4 mg/dL — AB (ref 8.9–10.3)
CHLORIDE: 113 mmol/L — AB (ref 101–111)
CO2: 23 mmol/L (ref 22–32)
CREATININE: 0.65 mg/dL (ref 0.44–1.00)
GFR calc Af Amer: >60 mL/min (ref >60)
GFR calc non Af Amer: >60 mL/min (ref >60)
GLUCOSE: 96 mg/dL (ref 65–99)
Potassium: 3.9 mmol/L (ref 3.5–5.1)
SODIUM: 141 mmol/L (ref 135–145)
Total Bilirubin: 1.2 mg/dL (ref 0.3–1.2)
Total Protein: 5.3 g/dL — ABNORMAL LOW (ref 6.5–8.1)

## 2014-12-25 LAB — CBC
HCT: 29.2 % — ABNORMAL LOW (ref 36.0–46.0)
HEMOGLOBIN: 9.8 g/dL — AB (ref 12.0–15.0)
MCH: 30.2 pg (ref 26.0–34.0)
MCHC: 33.6 g/dL (ref 30.0–36.0)
MCV: 90.1 fL (ref 78.0–100.0)
PLATELETS: 115 10*3/uL — AB (ref 150–400)
RBC: 3.24 MIL/uL — AB (ref 3.87–5.11)
RDW: 13.3 % (ref 11.5–15.5)
WBC: 6.3 10*3/uL (ref 4.0–10.5)

## 2014-12-25 LAB — TROPONIN I: Troponin I: <0.03 ng/mL (ref <0.031)

## 2014-12-25 NOTE — Progress Notes (Signed)
[facility] PROGRESS NOTE  Norma Daugherty ZOX:[id number] DOB: 1925-05-03 DOA: 12/24/2014 PCP: No primary care provider on file.  Length of stay: 1   Assessment/Plan: Active Problems:   Upper GI bleeding   Alzheimer's dementia     Probable Upper GI bleeding-probable upper GI bleeding, in the setting of NSAID use, husband confirms that the patient takes Aleve every now and then No bleeding overnight, status post 1 unit of packed red blood cells [facility] to see the patient for possible EGD today Continue Protonix drip Continue step down Keep patient nothing by mouth after midnight for possible EGD today    Alzheimer's dementia-patient does not take any medications for this  DO NOT RESUSCITATE, husband is the POA, Cromes,Bruce, discussed plan of care with him by the bedside  DVT prophylaxsis SCDs  Code Status:      Code Status Orders        Start     Ordered   12/25/14 0929  Do not attempt resuscitation (DNR)   Continuous    Question Answer Comment  In the event of cardiac or respiratory ARREST Do not call a "code blue"   In the event of cardiac or respiratory ARREST Do not perform Intubation, CPR, defibrillation or ACLS   In the event of cardiac or respiratory ARREST Use medication by any route, position, wound care, and other measures to relive pain and suffering. May use oxygen, suction and manual treatment of airway obstruction as needed for comfort.      12/25/14 8119       Disposition Plan:  Probable EGD today    Brief narrative: 81 year old female with a history of Alzheimer's dementia, does not provide any meaningful history, presents to the ER after she was noted to be weak this evening when she attempted to walk to the bathroom. Patient was laid down on the floor after she became weak. She did not fall. She did not lose consciousness. Patient was brought in by husband, on her way to the ED the patient had one episode of bloody  emesis. Blood pressure was noted to be 79/47. Patient reportedly did not have any bleeding at home. As per her home medication list the patient is on Naprosyn. 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 This polyp was removed. Patient's daughter, grandson are by the bedside, who are not patient's primary caregivers, and they are not able to provide any further history.   Consultants:   [facility]  Procedures:  None  Antibiotics: Anti-infectives    None         HPI/Subjective: Patient's husband is by the bedside, confirms that the patient is a DO NOT RESUSCITATE, patient does not provide any history,  Objective: Filed Vitals:   12/25/14 0032 12/25/14 0213 12/25/14 0400 12/25/14 0449  BP: 109/52 122/62 135/58   Pulse: 81  69   Temp: 98.3 F (36.8 C) 97.7 F (36.5 C)  97.7 F (36.5 C)  TempSrc: Axillary Axillary  Axillary  Resp: Height:      Weight:      SpO2: 100%  100%     Intake/Output Summary (Last 24 hours) at 12/25/14 0929 Last data filed at 12/25/14 0400  Gross per 24 hour  Intake   1295 ml  Output      0 ml  Net   1295 ml    Exam:  General: Awake but cannot communicate Lungs: Clear to auscultation bilaterally without wheezes or crackles Cardiovascular: Regular rate and rhythm without murmur gallop or rub normal S1 and S2 Abdomen: Nontender, nondistended, soft, bowel sounds positive, no rebound, no ascites, no appreciable mass Extremities: No significant cyanosis, clubbing, or edema bilateral lower extremities     Data Review   Micro Results Recent Results (from the past 240 hour(s))  MRSA PCR Screening     Status: None   Collection Time: 12/24/14  8:20 PM  Result Value Ref Range Status   MRSA by PCR NEGATIVE NEGATIVE Final    Comment:        The GeneXpert MRSA Assay (FDA approved for NASAL specimens only), is one component of  a comprehensive MRSA colonization surveillance program. It is not intended to diagnose MRSA infection nor to guide or monitor treatment for MRSA infections.     Radiology Reports Portable Chest 1 View  12/24/2014  CLINICAL DATA:  81 year old female with weakness, vomiting and hypotension for 1 day. EXAM: PORTABLE CHEST 1 VIEW COMPARISON:  Chest x-ray 11/06/2008. FINDINGS: Irregular opacities at the left lung base, concerning for pneumonia. Right lung is clear. Small left pleural effusion. No evidence of pulmonary edema. Heart size is normal. Upper mediastinal contours are within normal limits. Atherosclerosis in the thoracic aorta. New right shoulder arthroplasty noted but incompletely visualized. IMPRESSION: 1. Appearance the chest is concerning for left lower lobe pneumonia with small left parapneumonic pleural effusion. Followup PA and lateral chest X-ray is recommended in 3-4 weeks following trial of antibiotic therapy to ensure resolution and exclude underlying malignancy. 2. Atherosclerosis. Electronically Signed   By: Trudie Reed M.D.   On: 12/24/2014 19:49     CBC  Recent Labs Lab 12/24/14 1515 12/24/14 1531 12/24/14 2233 12/25/14 0422  WBC 6.3  --  7.9 6.3  HGB 10.7* 10.5* 9.4* 9.8*  HCT 32.6* 31.0* 28.3* 29.2*  PLT 157  --  130* 115*  MCV 93.4  --  91.9 90.1  MCH 30.7  --  30.5 30.2  MCHC 32.8  --  33.2 33.6  RDW 12.5  --  12.4 13.3  LYMPHSABS 2.0  --   --   --   MONOABS 0.2  --   --   --   EOSABS 0.1  --   --   --   BASOSABS 0.0  --   --   --     Chemistries   Recent Labs Lab 12/24/14 1515 12/24/14 1531 12/24/14 2052 12/25/14 0422  NA 140 141  --  141  K 4.0 4.0  --  3.9  CL 107 104  --  113*  CO2 24  --   --  23  GLUCOSE 137* 126*  --  96  BUN 54* 57*  --  33*  CREATININE 0.69 0.70  --  0.65  CALCIUM 9.2  --   --  8.4*  MG  --   --  2.0  --   AST 17  --  15 15  ALT 13*  --  12* 11*  ALKPHOS 52  --  49 44  BILITOT 0.7  --  0.6 1.2    ------------------------------------------------------------------------------------------------------------------ estimated creatinine clearance is 46.4 mL/min (by C-G formula based on Cr of 0.65). ------------------------------------------------------------------------------------------------------------------ No results for input(s): HGBA1C in the last 72 hours. ------------------------------------------------------------------------------------------------------------------ No results for input(s): CHOL, HDL, LDLCALC, TRIG, CHOLHDL, LDLDIRECT in the last 72 hours. ------------------------------------------------------------------------------------------------------------------ No results for input(s): TSH, T4TOTAL, T3FREE, THYROIDAB in  the last 72 hours.  Invalid input(s): FREET3 ------------------------------------------------------------------------------------------------------------------ No results for input(s): VITAMINB12, FOLATE, FERRITIN, TIBC, IRON, RETICCTPCT in the last 72 hours.  Coagulation profile  Recent Labs Lab 12/24/14 1515  INR 1.18    No results for input(s): DDIMER in the last 72 hours.  Cardiac Enzymes  Recent Labs Lab 12/24/14 2052 12/25/14 0725  TROPONINI <0.03 <0.03   ------------------------------------------------------------------------------------------------------------------ Invalid input(s): POCBNP   CBG: No results for input(s): GLUCAP in the last 168 hours.     Studies: Portable Chest 1 View  12/24/2014  CLINICAL DATA:  81 year old female with weakness, vomiting and hypotension for 1 day. EXAM: PORTABLE CHEST 1 VIEW COMPARISON:  Chest x-ray 11/06/2008. FINDINGS: Irregular opacities at the left lung base, concerning for pneumonia. Right lung is clear. Small left pleural effusion. No evidence of pulmonary edema. Heart size is normal. Upper mediastinal contours are within normal limits. Atherosclerosis in the thoracic aorta. New right  shoulder arthroplasty noted but incompletely visualized. IMPRESSION: 1. Appearance the chest is concerning for left lower lobe pneumonia with small left parapneumonic pleural effusion. Followup PA and lateral chest X-ray is recommended in 3-4 weeks following trial of antibiotic therapy to ensure resolution and exclude underlying malignancy. 2. Atherosclerosis. Electronically Signed   By: Trudie Reedaniel  Entrikin M.D.   On: 12/24/2014 19:49      No results found for: HGBA1C Lab Results  Component Value Date   CREATININE 0.65 12/25/2014       Scheduled Meds: . sodium chloride   Intravenous STAT  . sodium chloride   Intravenous Once  . [START ON 12/28/2014] pantoprazole (PROTONIX) IV  40 mg Intravenous Q12H   Continuous Infusions: . sodium chloride 100 mL/hr at 12/24/14 2019  . pantoprozole (PROTONIX) infusion 8 mg/hr (12/25/14 0843)    Active Problems:   Upper GI bleeding   Alzheimer's dementia    Time spent: 45 minutes   [facility],Norma Daugherty  [facility] Pager [phone]. If 7PM-7AM, please contact night-coverage at [email or web address], password [email or web address] 12/25/2014, 9:29 AM  LOS: 1 day

## 2014-12-25 NOTE — Consult Note (Signed)
[facility] Consultation Note  Referring Provider: Dr. Richarda Overlie [facility]) Primary Care Physician:  No primary care provider on file.  Reason for Consultation:  Anemia, GI bleeding  HPI: Norma Daugherty is a 81 y.o. female admitted for syncope and episode of melena and hematemesis x 1 yesterday afternoon.  Unable to obtain history due to her dementia.  No obvious abdominal pain.  No further hematemesis or blood in stool since yesterday.  Does use NSAIDs from time-to-time.  Had endoscopy for bleeding in 2012, with Dr. Randa Evens, has history of ulcerated hamartomatous polyp as well as gastritis.   Past Medical History  Diagnosis Date  . Alzheimer's dementia     No past surgical history on file.  Prior to Admission medications   Medication Sig Start Date End Date Taking? Authorizing Provider  Misc Natural Products (NARCOSOFT HERBAL LAX PO) Take 1 tablet by mouth daily.   Yes Historical Provider, MD  naproxen sodium (ANAPROX) 220 MG tablet Take 220 mg by mouth 2 (two) times daily as needed (arthritis pain, headache).   Yes Historical Provider, MD  OVER THE COUNTER MEDICATION Take 30 mLs by mouth daily. Youngevity - Beyond Osteo-fx   Yes Historical Provider, MD  OVER THE COUNTER MEDICATION Take 3 tablets by mouth daily. ALPHA Vitamin mineral and Omega 3-6-9 Support   Yes Historical Provider, MD  OVER THE COUNTER MEDICATION Take 1 mL by mouth daily. MINERAL SUPPORT LIQUID   Yes Historical Provider, MD  OVER THE COUNTER MEDICATION Take 1 mL by mouth daily. VITAMIN SUPPORT   Yes Historical Provider, MD  psyllium (METAMUCIL) 58.6 % powder Take 1 packet by mouth daily.    Yes Historical Provider, MD    Current Facility-Administered Medications  Medication Dose Route Frequency Provider Last Rate Last Dose  . 0.9 %  sodium chloride infusion   Intravenous STAT Mancel Bale, MD      . 0.9 %  sodium chloride infusion   Intravenous Continuous Richarda Overlie, MD 100 mL/hr at 12/24/14 2019     . 0.9 %  sodium chloride infusion   Intravenous Once Richarda Overlie, MD      . acetaminophen (TYLENOL) tablet 650 mg  650 mg Oral Q6H PRN Richarda Overlie, MD       Or  . acetaminophen (TYLENOL) suppository 650 mg  650 mg Rectal Q6H PRN Richarda Overlie, MD      . oxyCODONE (Oxy IR/ROXICODONE) immediate release tablet 5 mg  5 mg Oral Q4H PRN Richarda Overlie, MD      . pantoprazole (PROTONIX) 80 mg in sodium chloride 0.9 % 250 mL (0.32 mg/mL) infusion  8 mg/hr Intravenous Continuous Richarda Overlie, MD 25 mL/hr at 12/25/14 0843 8 mg/hr at 12/25/14 0843  . [START ON 12/28/2014] pantoprazole (PROTONIX) injection 40 mg  40 mg Intravenous Q12H Richarda Overlie, MD        Allergies as of 12/24/2014 - Review Complete 12/24/2014  Allergen Reaction Noted  . Morphine and related Nausea And Vomiting 10/08/2013    No family history on file.  Social History   Social History  . Marital Status: Widowed    Spouse Name: N/A  . Number of Children: N/A  . Years of Education: N/A   Occupational History  . Not on file.   Social History Main Topics  . Smoking status: Unknown If Ever Smoked  . Smokeless tobacco: Not on file  . Alcohol Use: Not on file  . Drug Use: Not on file  . Sexual Activity: Not  on file   Other Topics Concern  . Not on file   Social History Narrative    Review of Systems: Unable to obtain due to patient's chronic dementia  Physical Exam: Vital signs in last 24 hours: Temp:  [97.4 F (36.3 C)-98.3 F (36.8 C)] 97.5 F (36.4 C) (11/09 1200) Pulse Rate:  [65-105] 70 (11/09 1200) Resp:  [10-33] 16 (11/09 1200) BP: (79-141)/(45-80) 125/66 mmHg (11/09 1200) SpO2:  [91 %-100 %] 100 % (11/09 1200) FiO2 (%):  [0 %] 0 % (11/08 1908) Weight:  [66.4 kg (146 lb 6.2 oz)] 66.4 kg (146 lb 6.2 oz) (11/08 2017) Last BM Date: 12/24/14 General:   Awake, chronic dementia, appears in NAD Head:  Normocephalic and atraumatic. Eyes:  Sclera clear, no icterus.   Conjunctiva pale Ears:  Normal auditory  acuity. Nose:  No deformity, discharge,  or lesions. Mouth:  No deformity or lesions.  Oropharynx dry and pale Neck:  Supple; no masses or thyromegaly. Lungs:  Clear throughout to auscultation.   No wheezes, crackles, or rhonchi. No acute distress. Heart:  Regular rate and rhythm; no murmurs, clicks, rubs,  or gallops. Abdomen:  Soft, nontender and nondistended. No masses, hepatosplenomegaly or hernias noted. Normal bowel sounds, without guarding, and without rebound.     Msk:  Symmetrical without gross deformities. Normal posture. Pulses:  Normal pulses noted. Extremities:  Without clubbing or edema. Neurologic:  Oriented x 0; can not follow commands, but no obvious focal neurologic deficits. Skin:  Scattered ecchymoses, otherwise intact without significant lesions or rashes. Psych:  Alert and can track movements; oriented x 0; chronic dementia   Lab Results:  Recent Labs  12/24/14 1515 12/24/14 1531 12/24/14 2233 12/25/14 0422  WBC 6.3  --  7.9 6.3  HGB 10.7* 10.5* 9.4* 9.8*  HCT 32.6* 31.0* 28.3* 29.2*  PLT 157  --  130* 115*   BMET  Recent Labs  12/24/14 1515 12/24/14 1531 12/25/14 0422  NA 140 141 141  K 4.0 4.0 3.9  CL 107 104 113*  CO2 24  --  23  GLUCOSE 137* 126* 96  BUN 54* 57* 33*  CREATININE 0.69 0.70 0.65  CALCIUM 9.2  --  8.4*   LFT  Recent Labs  12/24/14 2052 12/25/14 0422  PROT 5.6* 5.3*  ALBUMIN 2.8* 2.6*  AST 15 15  ALT 12* 11*  ALKPHOS 49 44  BILITOT 0.6 1.2  BILIDIR <0.1*  --   IBILI NOT CALCULATED  --    PT/INR  Recent Labs  12/24/14 1515  LABPROT 15.2  INR 1.18    Studies/Results: Portable Chest 1 View  12/24/2014  CLINICAL DATA:  81 year old female with weakness, vomiting and hypotension for 1 day. EXAM: PORTABLE CHEST 1 VIEW COMPARISON:  Chest x-ray 11/06/2008. FINDINGS: Irregular opacities at the left lung base, concerning for pneumonia. Right lung is clear. Small left pleural effusion. No evidence of pulmonary edema.  Heart size is normal. Upper mediastinal contours are within normal limits. Atherosclerosis in the thoracic aorta. New right shoulder arthroplasty noted but incompletely visualized. IMPRESSION: 1. Appearance the chest is concerning for left lower lobe pneumonia with small left parapneumonic pleural effusion. Followup PA and lateral chest X-ray is recommended in 3-4 weeks following trial of antibiotic therapy to ensure resolution and exclude underlying malignancy. 2. Atherosclerosis. Electronically Signed   By: Trudie Reedaniel  Entrikin M.D.   On: 12/24/2014 19:49    Impression:  1.  Hematemesis and melena.  No evidence of bleeding since yesterday.  Given her decrease in BUN and her improvement of blood pressure (70s -->100s SBP) and lack of tachycardia, I doubt she is having ongoing bleeding (or at least substantial ongoing bleeding).  Given these symptoms, use of NSAIDs, and elevation of BUN, suspect upper GI bleeding source, ulcer leading consideration. 2.  Acute blood loss anemia, likely from #1 above. 3.  Alzheimer's dementia.  Unable to obtain history from patient.  Plan:  1.  Continue gentle hydration and PPI. 2.  Follow serial CBCs and transfuse as needed. 3.  Plan for endoscopy tomorrow morning 9 am, unless she has destabilizing bleeding in the interim, at which time we might need to consider urgent endoscopy. 4.  Risks (bleeding, infection, bowel perforation that could require surgery, sedation-related changes in cardiopulmonary systems), benefits (identification and possible treatment of source of symptoms, exclusion of certain causes of symptoms), and alternatives (watchful waiting, radiographic imaging studies, empiric medical treatment) of upper endoscopy (EGD) were explained to patient/family in detail and patient wishes to proceed. 5.  Clear liquid diet ok for now, NPO after midnight.   LOS: 1 day   Maryhelen Lindler M  12/25/2014, 1:43 PM  Pager [phone] If no answer or after 5 PM call  [phone]

## 2014-12-25 NOTE — Progress Notes (Signed)
Utilization Review Completed.  

## 2014-12-26 ENCOUNTER — Encounter (HOSPITAL_COMMUNITY)
Admission: EM | Disposition: A | Discharge status: Home / Self Care | Payer: Self-pay | Source: Home / Self Care | Attending: Internal Medicine

## 2014-12-26 ENCOUNTER — Encounter (HOSPITAL_COMMUNITY): Payer: Self-pay

## 2014-12-26 HISTORY — PX: ESOPHAGOGASTRODUODENOSCOPY: SHX5428

## 2014-12-26 LAB — CBC
HEMATOCRIT: 28.7 % — AB (ref 36.0–46.0)
HEMOGLOBIN: 9.7 g/dL — AB (ref 12.0–15.0)
MCH: 30.1 pg (ref 26.0–34.0)
MCHC: 33.8 g/dL (ref 30.0–36.0)
MCV: 89.1 fL (ref 78.0–100.0)
Platelets: 107 10*3/uL — ABNORMAL LOW (ref 150–400)
RBC: 3.22 MIL/uL — AB (ref 3.87–5.11)
RDW: 13.4 % (ref 11.5–15.5)
WBC: 4.5 10*3/uL (ref 4.0–10.5)

## 2014-12-26 LAB — COMPREHENSIVE METABOLIC PANEL
ALT: 12 U/L — AB (ref 14–54)
AST: 16 U/L (ref 15–41)
Albumin: 2.6 g/dL — ABNORMAL LOW (ref 3.5–5.0)
Alkaline Phosphatase: 47 U/L (ref 38–126)
Anion gap: 6 (ref 5–15)
BILIRUBIN TOTAL: 0.9 mg/dL (ref 0.3–1.2)
BUN: 12 mg/dL (ref 6–20)
CHLORIDE: 107 mmol/L (ref 101–111)
CO2: 22 mmol/L (ref 22–32)
CREATININE: 0.63 mg/dL (ref 0.44–1.00)
Calcium: 8.4 mg/dL — ABNORMAL LOW (ref 8.9–10.3)
Glucose, Bld: 104 mg/dL — ABNORMAL HIGH (ref 65–99)
POTASSIUM: 3.7 mmol/L (ref 3.5–5.1)
Sodium: 135 mmol/L (ref 135–145)
TOTAL PROTEIN: 5.1 g/dL — AB (ref 6.5–8.1)

## 2014-12-26 LAB — TYPE AND SCREEN
ABO/RH(D): A POS
ANTIBODY SCREEN: NEGATIVE
UNIT DIVISION: 0

## 2014-12-26 SURGERY — EGD (ESOPHAGOGASTRODUODENOSCOPY)
Anesthesia: Moderate Sedation | Laterality: Left

## 2014-12-26 MED ORDER — PANTOPRAZOLE SODIUM 40 MG PO PACK
40.0000 mg | PACK | Freq: Two times a day (BID) | ORAL | Status: DC
  Administered 2014-12-26 – 2014-12-27 (×2): 40 mg via ORAL
  Filled 2014-12-26 (×4): qty 20

## 2014-12-26 MED ORDER — SODIUM CHLORIDE 0.9 % IV SOLN: INTRAVENOUS | Status: DC

## 2014-12-26 MED ORDER — FENTANYL CITRATE (PF) 100 MCG/2ML IJ SOLN
INTRAMUSCULAR | Status: AC
  Filled 2014-12-26: qty 2

## 2014-12-26 MED ORDER — PANTOPRAZOLE SODIUM 40 MG PO TBEC: 40.0000 mg | DELAYED_RELEASE_TABLET | Freq: Two times a day (BID) | ORAL | Status: DC

## 2014-12-26 MED ORDER — FENTANYL CITRATE (PF) 100 MCG/2ML IJ SOLN
INTRAMUSCULAR | Status: DC | PRN
  Administered 2014-12-26 (×2): 12.5 ug via INTRAVENOUS

## 2014-12-26 MED ORDER — MIDAZOLAM HCL 5 MG/ML IJ SOLN
INTRAMUSCULAR | Status: AC
  Filled 2014-12-26: qty 2

## 2014-12-26 MED ORDER — SODIUM CHLORIDE 0.9 % IV BOLUS (SEPSIS)
500.0000 mL | Freq: Once | INTRAVENOUS | Status: AC
  Administered 2014-12-26: 1000 mL via INTRAVENOUS

## 2014-12-26 MED ORDER — PNEUMOCOCCAL VAC POLYVALENT 25 MCG/0.5ML IJ INJ
0.5000 mL | INJECTION | INTRAMUSCULAR | Status: AC
  Administered 2014-12-27: 0.5 mL via INTRAMUSCULAR
  Filled 2014-12-26: qty 0.5

## 2014-12-26 MED ORDER — MIDAZOLAM HCL 10 MG/2ML IJ SOLN
INTRAMUSCULAR | Status: DC | PRN
  Administered 2014-12-26 (×3): 1 mg via INTRAVENOUS

## 2014-12-26 MED ORDER — INFLUENZA VAC SPLIT QUAD 0.5 ML IM SUSY
0.5000 mL | PREFILLED_SYRINGE | INTRAMUSCULAR | Status: AC
  Administered 2014-12-27: 0.5 mL via INTRAMUSCULAR
  Filled 2014-12-26: qty 0.5

## 2014-12-26 NOTE — Progress Notes (Signed)
Patient tx to 2W15. VSS. Patient bathed and bed linen changed. Family oriented to room and unit. Call bell within reach. Will continue to monitor.   Valinda HoarLexie Helaman Mecca RN

## 2014-12-26 NOTE — H&P (View-Only) (Signed)
[facility] Consultation Note  Referring Provider: Dr. Nayana Abrol ([facility]) Primary Care Physician:  No primary care provider on file.  Reason for Consultation:  Anemia, GI bleeding  HPI: Norma Daugherty is a 81 y.o. female admitted for syncope and episode of melena and hematemesis x 1 yesterday afternoon.  Unable to obtain history due to her dementia.  No obvious abdominal pain.  No further hematemesis or blood in stool since yesterday.  Does use NSAIDs from time-to-time.  Had endoscopy for bleeding in 2012, with Dr. Edwards, has history of ulcerated hamartomatous polyp as well as gastritis.   Past Medical History  Diagnosis Date  . Alzheimer's dementia     No past surgical history on file.  Prior to Admission medications   Medication Sig Start Date End Date Taking? Authorizing Provider  Misc Natural Products (NARCOSOFT HERBAL LAX PO) Take 1 tablet by mouth daily.   Yes Historical Provider, MD  naproxen sodium (ANAPROX) 220 MG tablet Take 220 mg by mouth 2 (two) times daily as needed (arthritis pain, headache).   Yes Historical Provider, MD  OVER THE COUNTER MEDICATION Take 30 mLs by mouth daily. Youngevity - Beyond Osteo-fx   Yes Historical Provider, MD  OVER THE COUNTER MEDICATION Take 3 tablets by mouth daily. ALPHA Vitamin mineral and Omega 3-6-9 Support   Yes Historical Provider, MD  OVER THE COUNTER MEDICATION Take 1 mL by mouth daily. MINERAL SUPPORT LIQUID   Yes Historical Provider, MD  OVER THE COUNTER MEDICATION Take 1 mL by mouth daily. VITAMIN SUPPORT   Yes Historical Provider, MD  psyllium (METAMUCIL) 58.6 % powder Take 1 packet by mouth daily.    Yes Historical Provider, MD    Current Facility-Administered Medications  Medication Dose Route Frequency Provider Last Rate Last Dose  . 0.9 %  sodium chloride infusion   Intravenous STAT Elliott Wentz, MD      . 0.9 %  sodium chloride infusion   Intravenous Continuous Nayana Abrol, MD 100 mL/hr at 12/24/14 2019     . 0.9 %  sodium chloride infusion   Intravenous Once Nayana Abrol, MD      . acetaminophen (TYLENOL) tablet 650 mg  650 mg Oral Q6H PRN Nayana Abrol, MD       Or  . acetaminophen (TYLENOL) suppository 650 mg  650 mg Rectal Q6H PRN Nayana Abrol, MD      . oxyCODONE (Oxy IR/ROXICODONE) immediate release tablet 5 mg  5 mg Oral Q4H PRN Nayana Abrol, MD      . pantoprazole (PROTONIX) 80 mg in sodium chloride 0.9 % 250 mL (0.32 mg/mL) infusion  8 mg/hr Intravenous Continuous Nayana Abrol, MD 25 mL/hr at 12/25/14 0843 8 mg/hr at 12/25/14 0843  . [START ON 12/28/2014] pantoprazole (PROTONIX) injection 40 mg  40 mg Intravenous Q12H Nayana Abrol, MD        Allergies as of 12/24/2014 - Review Complete 12/24/2014  Allergen Reaction Noted  . Morphine and related Nausea And Vomiting 10/08/2013    No family history on file.  Social History   Social History  . Marital Status: Widowed    Spouse Name: N/A  . Number of Children: N/A  . Years of Education: N/A   Occupational History  . Not on file.   Social History Main Topics  . Smoking status: Unknown If Ever Smoked  . Smokeless tobacco: Not on file  . Alcohol Use: Not on file  . Drug Use: Not on file  . Sexual Activity: Not   on file   Other Topics Concern  . Not on file   Social History Narrative    Review of Systems: Unable to obtain due to patient's chronic dementia  Physical Exam: Vital signs in last 24 hours: Temp:  [97.4 F (36.3 C)-98.3 F (36.8 C)] 97.5 F (36.4 C) (11/09 1200) Pulse Rate:  [65-105] 70 (11/09 1200) Resp:  [10-33] 16 (11/09 1200) BP: (79-141)/(45-80) 125/66 mmHg (11/09 1200) SpO2:  [91 %-100 %] 100 % (11/09 1200) FiO2 (%):  [0 %] 0 % (11/08 1908) Weight:  [66.4 kg (146 lb 6.2 oz)] 66.4 kg (146 lb 6.2 oz) (11/08 2017) Last BM Date: 12/24/14 General:   Awake, chronic dementia, appears in NAD Head:  Normocephalic and atraumatic. Eyes:  Sclera clear, no icterus.   Conjunctiva pale Ears:  Normal auditory  acuity. Nose:  No deformity, discharge,  or lesions. Mouth:  No deformity or lesions.  Oropharynx dry and pale Neck:  Supple; no masses or thyromegaly. Lungs:  Clear throughout to auscultation.   No wheezes, crackles, or rhonchi. No acute distress. Heart:  Regular rate and rhythm; no murmurs, clicks, rubs,  or gallops. Abdomen:  Soft, nontender and nondistended. No masses, hepatosplenomegaly or hernias noted. Normal bowel sounds, without guarding, and without rebound.     Msk:  Symmetrical without gross deformities. Normal posture. Pulses:  Normal pulses noted. Extremities:  Without clubbing or edema. Neurologic:  Oriented x 0; can not follow commands, but no obvious focal neurologic deficits. Skin:  Scattered ecchymoses, otherwise intact without significant lesions or rashes. Psych:  Alert and can track movements; oriented x 0; chronic dementia   Lab Results:  Recent Labs  12/24/14 1515 12/24/14 1531 12/24/14 2233 12/25/14 0422  WBC 6.3  --  7.9 6.3  HGB 10.7* 10.5* 9.4* 9.8*  HCT 32.6* 31.0* 28.3* 29.2*  PLT 157  --  130* 115*   BMET  Recent Labs  12/24/14 1515 12/24/14 1531 12/25/14 0422  NA 140 141 141  K 4.0 4.0 3.9  CL 107 104 113*  CO2 24  --  23  GLUCOSE 137* 126* 96  BUN 54* 57* 33*  CREATININE 0.69 0.70 0.65  CALCIUM 9.2  --  8.4*   LFT  Recent Labs  12/24/14 2052 12/25/14 0422  PROT 5.6* 5.3*  ALBUMIN 2.8* 2.6*  AST 15 15  ALT 12* 11*  ALKPHOS 49 44  BILITOT 0.6 1.2  BILIDIR <0.1*  --   IBILI NOT CALCULATED  --    PT/INR  Recent Labs  12/24/14 1515  LABPROT 15.2  INR 1.18    Studies/Results: Portable Chest 1 View  12/24/2014  CLINICAL DATA:  81 year old female with weakness, vomiting and hypotension for 1 day. EXAM: PORTABLE CHEST 1 VIEW COMPARISON:  Chest x-ray 11/06/2008. FINDINGS: Irregular opacities at the left lung base, concerning for pneumonia. Right lung is clear. Small left pleural effusion. No evidence of pulmonary edema.  Heart size is normal. Upper mediastinal contours are within normal limits. Atherosclerosis in the thoracic aorta. New right shoulder arthroplasty noted but incompletely visualized. IMPRESSION: 1. Appearance the chest is concerning for left lower lobe pneumonia with small left parapneumonic pleural effusion. Followup PA and lateral chest X-ray is recommended in 3-4 weeks following trial of antibiotic therapy to ensure resolution and exclude underlying malignancy. 2. Atherosclerosis. Electronically Signed   By: Trudie Reedaniel  Entrikin M.D.   On: 12/24/2014 19:49    Impression:  1.  Hematemesis and melena.  No evidence of bleeding since yesterday.  Given her decrease in BUN and her improvement of blood pressure (70s -->100s SBP) and lack of tachycardia, I doubt she is having ongoing bleeding (or at least substantial ongoing bleeding).  Given these symptoms, use of NSAIDs, and elevation of BUN, suspect upper GI bleeding source, ulcer leading consideration. 2.  Acute blood loss anemia, likely from #1 above. 3.  Alzheimer's dementia.  Unable to obtain history from patient.  Plan:  1.  Continue gentle hydration and PPI. 2.  Follow serial CBCs and transfuse as needed. 3.  Plan for endoscopy tomorrow morning 9 am, unless she has destabilizing bleeding in the interim, at which time we might need to consider urgent endoscopy. 4.  Risks (bleeding, infection, bowel perforation that could require surgery, sedation-related changes in cardiopulmonary systems), benefits (identification and possible treatment of source of symptoms, exclusion of certain causes of symptoms), and alternatives (watchful waiting, radiographic imaging studies, empiric medical treatment) of upper endoscopy (EGD) were explained to patient/family in detail and patient wishes to proceed. 5.  Clear liquid diet ok for now, NPO after midnight.   LOS: 1 day   Quenna Doepke M  12/25/2014, 1:43 PM  Pager [phone] If no answer or after 5 PM call  [phone]

## 2014-12-26 NOTE — Op Note (Signed)
[facility] [address] [place], [id number]   ENDOSCOPY PROCEDURE REPORT  PATIENT: Norma Daugherty, Norma Daugherty  MR#: [id number] BIRTHDATE: 09-May-1925 , 89  yrs. old GENDER: female ENDOSCOPIST: Willis ModenaWilliam Eiza Canniff, MD REFERRED BY:  [facility] PROCEDURE DATE:  12/26/2014 PROCEDURE:  EGD, diagnostic ASA CLASS:     Class III INDICATIONS:  melena, hematemesis, anemia. MEDICATIONS: Fentanyl 25 mcg IV and Versed 3 mg IV TOPICAL ANESTHETIC: None.  DESCRIPTION OF PROCEDURE: After the risks benefits and alternatives of the procedure were thoroughly explained, informed consent was obtained.  The Pentax Gastroscope Peds [id number] endoscope was introduced through the mouth and advanced to the second portion of the duodenum. The instrument was slowly withdrawn as the mucosa was fully examined. Estimated blood loss is zero unless otherwise noted in this procedure report.    Findings:  Small hiatal hernia; widely patent Schatzki's ring; otherwise normal esophagus; no esophagitis, Mallory-Weiss Tear or varices were identified.  Antral erythema and gastritis noted. Couple small pre-pyloric ulcerations noted.  Dominant ulcer in proximal antrum, about 6mm in diameter, clean-based without adherent clot, visible vessel, or active bleeding.  Remainder of the stomach (including retroflexed views of the cardia), pylorus and duodenum to the second portion was normal.  No old or fresh blood was seen to the extent of our examination.              The scope was then withdrawn from the patient and the procedure completed.  COMPLICATIONS: There were no immediate complications.  ENDOSCOPIC IMPRESSION:     As above. Bleeding highly likely from gastric ulcer which is in turn highly likely due to NSAIDs.   Endoscopic appearance of ulcer would suggest very low risk for rebleeding.  RECOMMENDATIONS:     1.  Watch for potential complications of procedure. 2.  No ASA/NsAIDs. 3.   Protonix 40 mg po bid x 8 weeks, then 40 mg po qd thereafter indefinitely. 4.  Full liquid diet now, advance as tolerated. 5.  If patient tolerates diet well and has no further bleeding and stable Hgb, she should be able to be discharged home tomorrow. 6.  [facility] will sign-off; please call with questions; she can follow-up with [facility] as outpatient on an as-needed basis.  eSigned:  Willis ModenaWilliam Jaydeen Darley, MD 12/26/2014 9:01 AM   CC:  CPT CODES: ICD CODES:  The ICD and CPT codes recommended by this software are interpretations from the data that the clinical staff has captured with the software.  The verification of the translation of this report to the ICD and CPT codes and modifiers is the sole responsibility of the health care institution and practicing physician where this report was generated.  [employer] will not be held responsible for the validity of the ICD and CPT codes included on this report.  AMA assumes no liability for data contained or not contained herein. CPT is a Publishing rights managerregistered trademark of the [employer].

## 2014-12-26 NOTE — Plan of Care (Signed)
Problem: Bowel/Gastric: Goal: Will show no signs and symptoms of gastrointestinal bleeding Outcome: Progressing No signs of active bleeding at this time

## 2014-12-26 NOTE — Progress Notes (Signed)
[facility] PROGRESS NOTE  Gaylene BrooksShirley N Purifoy UVO:[id number] DOB: 1925-04-21 DOA: 12/24/2014 PCP: No primary care provider on file.  Length of stay: 2   Assessment/Plan: Active Problems:   Upper GI bleeding   Alzheimer's dementia     Probable Upper GI bleeding-status post EGD today, bleeding highly likely from a gastric ulcer as per op note Likely in the setting of NSAID use, husband confirms that the patient takes Aleve every now and then Endoscopic appearance of the ulcer would suggest very low risk of rebleeding If no bleeding overnight and if hemoglobin stable that the patient is anticipated to be discharged tomorrow Gastroenterology has signed off Hemoglobin stable after one unit of packed red blood cells 2 days Protonix 40 mg po bid x 8 weeks, then 40 mg po qd thereafter indefinitely. Transfer to telemetry Advance to soft diet    Alzheimer's dementia-patient does not take any medications for this  DO NOT RESUSCITATE, husband is the POA, Cromes,Bruce, discussed plan of care with him by the bedside  DVT prophylaxsis SCDs  Code Status:      Code Status Orders        Start     Ordered   12/25/14 0929  Do not attempt resuscitation (DNR)   Continuous    Question Answer Comment  In the event of cardiac or respiratory ARREST Do not call a "code blue"   In the event of cardiac or respiratory ARREST Do not perform Intubation, CPR, defibrillation or ACLS   In the event of cardiac or respiratory ARREST Use medication by any route, position, wound care, and other measures to relive pain and suffering. May use oxygen, suction and manual treatment of airway obstruction as needed for comfort.      12/25/14 [id number]       Disposition Plan:  Anticipate discharge tomorrow    Brief narrative: 81 year old female with a history of Alzheimer's dementia, does not provide any meaningful history, presents to the ER after she was noted to be weak this evening when she  attempted to walk to the bathroom. Patient was laid down on the floor after she became weak. She did not fall. She did not lose consciousness. Patient was brought in by husband, on her way to the ED the patient had one episode of bloody emesis. Blood pressure was noted to be 79/47. Patient reportedly did not have any bleeding at home. As per her home medication list the patient is on Naprosyn. 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 This polyp was removed. Patient's daughter, grandson are by the bedside, who are not patient's primary caregivers, and they are not able to provide any further history.   Cons[facility]ants[facility]   [facility]  Procedures:  None  Antibiotics: Anti-infectives    None         HPI/Subjective: No bleeding overnight  Objective: Filed Vitals:   12/26/14 0915 12/26/14 0920 12/26/14 0930 12/26/14 0935  BP: 94/44 101/32 105/43 116/47  Pulse: 82 83 84 83  Temp:      TempSrc:      Resp: 13 13 12 11   Height:      Weight:      SpO2: 94% 94% 94% 96%    Intake/Output Summary (Last 24 hours) at 12/26/14 1106 Last data filed at 12/26/14 0600  Gross per 24 hour  Intake   2295 ml  Output  0 ml  Net   2295 ml    Exam:  General: Awake but cannot communicate Lungs: Clear to auscultation bilaterally without wheezes or crackles Cardiovascular: Regular rate and rhythm without murmur gallop or rub normal S1 and S2 Abdomen: Nontender, nondistended, soft, bowel sounds positive, no rebound, no ascites, no appreciable mass Extremities: No significant cyanosis, clubbing, or edema bilateral lower extremities     Data Review   Micro Results Recent Results (from the past 240 hour(s))  MRSA PCR Screening     Status: None   Collection Time: 12/24/14  8:20 PM  Result Value Ref Range Status   MRSA by PCR NEGATIVE NEGATIVE Final    Comment:        The GeneXpert MRSA  Assay (FDA approved for NASAL specimens only), is one component of a comprehensive MRSA colonization surveillance program. It is not intended to diagnose MRSA infection nor to guide or monitor treatment for MRSA infections.     Radiology Reports Portable Chest 1 View  12/24/2014  CLINICAL DATA:  81 year old female with weakness, vomiting and hypotension for 1 day. EXAM: PORTABLE CHEST 1 VIEW COMPARISON:  Chest x-ray 11/06/2008. FINDINGS: Irregular opacities at the left lung base, concerning for pneumonia. Right lung is clear. Small left pleural effusion. No evidence of pulmonary edema. Heart size is normal. Upper mediastinal contours are within normal limits. Atherosclerosis in the thoracic aorta. New right shoulder arthroplasty noted but incompletely visualized. IMPRESSION: 1. Appearance the chest is concerning for left lower lobe pneumonia with small left parapneumonic pleural effusion. Followup PA and lateral chest X-ray is recommended in 3-4 weeks following trial of antibiotic therapy to ensure resolution and exclude underlying malignancy. 2. Atherosclerosis. Electronically Signed   By: Trudie Reed M.D.   On: 12/24/2014 19:49     CBC  Recent Labs Lab 12/24/14 1515 12/24/14 1531 12/24/14 2233 12/25/14 0422 12/26/14 0418  WBC 6.3  --  7.9 6.3 4.5  HGB 10.7* 10.5* 9.4* 9.8* 9.7*  HCT 32.6* 31.0* 28.3* 29.2* 28.7*  PLT 157  --  130* 115* 107*  MCV 93.4  --  91.9 90.1 89.1  MCH 30.7  --  30.5 30.2 30.1  MCHC 32.8  --  33.2 33.6 33.8  RDW 12.5  --  12.4 13.3 13.4  LYMPHSABS 2.0  --   --   --   --   MONOABS 0.2  --   --   --   --   EOSABS 0.1  --   --   --   --   BASOSABS 0.0  --   --   --   --     Chemistries   Recent Labs Lab 12/24/14 1515 12/24/14 1531 12/24/14 2052 12/25/14 0422 12/26/14 0418  NA 140 141  --  141 135  K 4.0 4.0  --  3.9 3.7  CL 107 104  --  113* 107  CO2 24  --   --  23 22  GLUCOSE 137* 126*  --  96 104*  BUN 54* 57*  --  33* 12   CREATININE 0.69 0.70  --  0.65 0.63  CALCIUM 9.2  --   --  8.4* 8.4*  MG  --   --  2.0  --   --   AST 17  --  ALT 13*  --  12* 11* 12*  ALKPHOS 52  --  49 44 47  BILITOT 0.7  --  0.6 1.2 0.9   ------------------------------------------------------------------------------------------------------------------  estimated creatinine clearance is 46.4 mL/min (by C-G formula based on Cr of 0.63). ------------------------------------------------------------------------------------------------------------------ No results for input(s): HGBA1C in the last 72 hours. ------------------------------------------------------------------------------------------------------------------ No results for input(s): CHOL, HDL, LDLCALC, TRIG, CHOLHDL, LDLDIRECT in the last 72 hours. ------------------------------------------------------------------------------------------------------------------ No results for input(s): TSH, T4TOTAL, T3FREE, THYROIDAB in the last 72 hours.  Invalid input(s): FREET3 ------------------------------------------------------------------------------------------------------------------ No results for input(s): VITAMINB12, FOLATE, FERRITIN, TIBC, IRON, RETICCTPCT in the last 72 hours.  Coagulation profile  Recent Labs Lab 12/24/14 1515  INR 1.18    No results for input(s): DDIMER in the last 72 hours.  Cardiac Enzymes  Recent Labs Lab 12/24/14 2052 12/25/14 0725  TROPONINI <0.03 <0.03   ------------------------------------------------------------------------------------------------------------------ Invalid input(s): POCBNP   CBG: No results for input(s): GLUCAP in the last 168 hours.     Studies: Portable Chest 1 View  12/24/2014  CLINICAL DATA:  81 year old female with weakness, vomiting and hypotension for 1 day. EXAM: PORTABLE CHEST 1 VIEW COMPARISON:  Chest x-ray 11/06/2008. FINDINGS: Irregular opacities at the left lung base, concerning for  pneumonia. Right lung is clear. Small left pleural effusion. No evidence of pulmonary edema. Heart size is normal. Upper mediastinal contours are within normal limits. Atherosclerosis in the thoracic aorta. New right shoulder arthroplasty noted but incompletely visualized. IMPRESSION: 1. Appearance the chest is concerning for left lower lobe pneumonia with small left parapneumonic pleural effusion. Followup PA and lateral chest X-ray is recommended in 3-4 weeks following trial of antibiotic therapy to ensure resolution and exclude underlying malignancy. 2. Atherosclerosis. Electronically Signed   By: Trudie Reed M.D.   On: 12/24/2014 19:49      No results found for: HGBA1C Lab Results  Component Value Date   CREATININE 0.63 12/26/2014       Scheduled Meds: . sodium chloride   Intravenous Once  . pantoprazole sodium  40 mg Oral BID   Continuous Infusions: . sodium chloride 100 mL/hr at 12/26/14 1053    Active Problems:   Upper GI bleeding   Alzheimer's dementia    Time spent: 45 minutes   [facility]  [facility] Pager [phone]. If 7PM-7AM, please contact night-coverage at [email or web address], password [facility] 12/26/2014, 11:06 AM  LOS: 2 days

## 2014-12-26 NOTE — Interval H&P Note (Signed)
History and Physical Interval Note:  12/26/2014 8:20 AM  Norma BrooksShirley N Cranfield  has presented today for surgery, with the diagnosis of anemia, melena, hematemesis  The various methods of treatment have been discussed with the patient and family. After consideration of risks, benefits and other options for treatment, the patient has consented to  Procedure(s): ESOPHAGOGASTRODUODENOSCOPY (EGD) (Left) as a surgical intervention .  The patient's history has been reviewed, patient examined, no change in status, stable for surgery.  I have reviewed the patient's chart and labs.  Questions were answered to the patient's satisfaction.     Freddy JakschUTLAW,Zaharah Amir M

## 2014-12-27 ENCOUNTER — Encounter (HOSPITAL_COMMUNITY): Payer: Self-pay | Admitting: Gastroenterology

## 2014-12-27 DIAGNOSIS — K254 Chronic or unspecified gastric ulcer with hemorrhage: Secondary | ICD-10-CM | POA: Diagnosis not present

## 2014-12-27 LAB — CBC
HEMATOCRIT: 29.3 % — AB (ref 36.0–46.0)
HEMOGLOBIN: 9.9 g/dL — AB (ref 12.0–15.0)
MCH: 29.8 pg (ref 26.0–34.0)
MCHC: 33.8 g/dL (ref 30.0–36.0)
MCV: 88.3 fL (ref 78.0–100.0)
Platelets: 106 10*3/uL — ABNORMAL LOW (ref 150–400)
RBC: 3.32 MIL/uL — AB (ref 3.87–5.11)
RDW: 12.9 % (ref 11.5–15.5)
WBC: 4.9 10*3/uL (ref 4.0–10.5)

## 2014-12-27 LAB — BASIC METABOLIC PANEL
ANION GAP: 6 (ref 5–15)
BUN: 7 mg/dL (ref 6–20)
CHLORIDE: 110 mmol/L (ref 101–111)
CO2: 24 mmol/L (ref 22–32)
CREATININE: 0.56 mg/dL (ref 0.44–1.00)
Calcium: 8.5 mg/dL — ABNORMAL LOW (ref 8.9–10.3)
GFR calc non Af Amer: >60 mL/min (ref >60)
Glucose, Bld: 100 mg/dL — ABNORMAL HIGH (ref 65–99)
POTASSIUM: 3.6 mmol/L (ref 3.5–5.1)
SODIUM: 140 mmol/L (ref 135–145)

## 2014-12-27 MED ORDER — PANTOPRAZOLE SODIUM 40 MG PO TBEC: 40.0000 mg | DELAYED_RELEASE_TABLET | Freq: Two times a day (BID) | ORAL | Status: DC

## 2014-12-27 NOTE — Care Management Important Message (Signed)
Important Message  Patient Details  Name: Norma Daugherty MRN: [id number] Date of Birth: Sep 14, 1925   Medicare Important Message Given:  Yes    Elliot CousinShavis, Orlo Brickle Ellen, RN 12/27/2014, 10:52 AM

## 2014-12-27 NOTE — Care Management Important Message (Signed)
Important Message  Patient Details  Name: Norma Daugherty MRN: [id number] Date of Birth: 20-Jun-1925   Medicare Important Message Given:  Yes    [name] P Deryk Bozman 12/27/2014, 2:18 PM

## 2014-12-27 NOTE — Care Management Note (Signed)
Case Management Note  Patient Details  Name: Norma Daugherty MRN: [id number] Date of Birth: [birth year]  Subjective/Objective:  Upper GI Bleed                  Action/Plan: NCM spoke to pt's husband, Norma Daugherty [phone]. No NCM needs identified. Husband states they have a wheelchair at home and pt has 24 hour assistance at home.   Expected Discharge Date:  12/27/2014               Expected Discharge Plan:  Home/Self Care  In-House Referral:     Discharge planning Services  CM Consult   Status of Service:  Completed, signed off  Medicare Important Message Given:  Yes Date Medicare IM Given:    Medicare IM give by:    Date Additional Medicare IM Given:    Additional Medicare Important Message give by:     If discussed at Long Length of Stay Meetings, dates discussed:    Additional Comments:  Elliot CousinShavis, Jenah Vanasten Ellen, RN 12/27/2014, 10:55 AM

## 2014-12-27 NOTE — Discharge Summary (Signed)
Physician Discharge Summary  Norma Daugherty MRN: [id number] DOB/AGE: 08-30-1925 81 y.o.  PCP: No primary care provider on file.   Admit date: 01/08/2015 Discharge date: 12/27/2014  Discharge Diagnoses:     Active Problems:   Upper GI bleeding   Alzheimer's dementia    Follow-up recommendations Follow-up with PCP in 3-5 days , including all  additional recommended appointments as below Follow-up CBC, CMP in 3-5 days follow-up with [facility] as outpatient on an as-needed basis.     Medication List    STOP taking these medications        naproxen sodium 220 MG tablet  Commonly known as:  ANAPROX     OVER THE COUNTER MEDICATION      TAKE these medications        NARCOSOFT HERBAL LAX PO  Take 1 tablet by mouth daily.     pantoprazole 40 MG tablet  Commonly known as:  PROTONIX  Take 1 tablet (40 mg total) by mouth 2 (two) times daily.     psyllium 58.6 % powder  Commonly known as:  METAMUCIL  Take 1 packet by mouth daily.         Discharge Condition: Stable Discharge Instructions       Discharge Instructions    Diet - low sodium heart healthy    Complete by:  As directed      Increase activity slowly    Complete by:  As directed            Allergies  Allergen Reactions  . Morphine And Related Nausea And Vomiting      Disposition: 01-Home or Self Care   Consults:  Gastroenterology   Significant Diagnostic Studies:  Portable Chest 1 View  2015-01-08  CLINICAL DATA:  81 year old female with weakness, vomiting and hypotension for 1 day. EXAM: PORTABLE CHEST 1 VIEW COMPARISON:  Chest x-ray 11/06/2008. FINDINGS: Irregular opacities at the left lung base, concerning for pneumonia. Right lung is clear. Small left pleural effusion. No evidence of pulmonary edema. Heart size is normal. Upper mediastinal contours are within normal limits. Atherosclerosis in the thoracic aorta. New right shoulder arthroplasty noted but incompletely visualized.  IMPRESSION: 1. Appearance the chest is concerning for left lower lobe pneumonia with small left parapneumonic pleural effusion. Followup PA and lateral chest X-ray is recommended in 3-4 weeks following trial of antibiotic therapy to ensure resolution and exclude underlying malignancy. 2. Atherosclerosis. Electronically Signed   By: Vinnie Langton M.D.   On: 01-08-2015 19:49        Filed Weights   01-08-2015 2017  Weight: 66.4 kg (146 lb 6.2 oz)     Microbiology: Recent Results (from the past 240 hour(s))  MRSA PCR Screening     Status: None   Collection Time: 01/08/15  8:20 PM  Result Value Ref Range Status   MRSA by PCR NEGATIVE NEGATIVE Final    Comment:        The GeneXpert MRSA Assay (FDA approved for NASAL specimens only), is one component of a comprehensive MRSA colonization surveillance program. It is not intended to diagnose MRSA infection nor to guide or monitor treatment for MRSA infections.        Blood Culture No results found for: SDES, [place], CULT, REPTSTATUS    Labs: Results for orders placed or performed during the hospital encounter of 2015/01/08 (from the past 48 hour(s))  CBC     Status: Abnormal   Collection Time: 12/26/14  4:18 AM  Result Value  Ref Range   WBC 4.5 4.0 - 10.5 K/uL   RBC 3.22 (L) 3.87 - 5.11 MIL/uL   Hemoglobin 9.7 (L) 12.0 - 15.0 g/dL   HCT 28.7 (L) 36.0 - 46.0 %   MCV 89.1 78.0 - 100.0 fL   MCH 30.1 26.0 - 34.0 pg   MCHC 33.8 30.0 - 36.0 g/dL   RDW 13.4 11.5 - 15.5 %   Platelets 107 (L) 150 - 400 K/uL    Comment: CONSISTENT WITH PREVIOUS RESULT  Comprehensive metabolic panel     Status: Abnormal   Collection Time: 12/26/14  4:18 AM  Result Value Ref Range   Sodium 135 135 - 145 mmol/L   Potassium 3.7 3.5 - 5.1 mmol/L   Chloride 107 101 - 111 mmol/L   CO2 22 22 - 32 mmol/L   Glucose, Bld 104 (H) 65 - 99 mg/dL   BUN 12 6 - 20 mg/dL   Creatinine, Ser 0.63 0.44 - 1.00 mg/dL   Calcium 8.4 (L) 8.9 - 10.3 mg/dL    Total Protein 5.1 (L) 6.5 - 8.1 g/dL   Albumin 2.6 (L) 3.5 - 5.0 g/dL   AST 16 15 - 41 U/L   ALT 12 (L) 14 - 54 U/L   Alkaline Phosphatase 47 38 - 126 U/L   Total Bilirubin 0.9 0.3 - 1.2 mg/dL   GFR calc non Af Amer >60 >60 mL/min   GFR calc Af Amer >60 >60 mL/min    Comment: (NOTE) The eGFR has been calculated using the CKD EPI equation. This calculation has not been validated in all clinical situations. eGFR's persistently <60 mL/min signify possible Chronic Kidney Disease.    Anion gap 6 5 - 15  CBC     Status: Abnormal   Collection Time: 12/27/14  3:17 AM  Result Value Ref Range   WBC 4.9 4.0 - 10.5 K/uL   RBC 3.32 (L) 3.87 - 5.11 MIL/uL   Hemoglobin 9.9 (L) 12.0 - 15.0 g/dL   HCT 29.3 (L) 36.0 - 46.0 %   MCV 88.3 78.0 - 100.0 fL   MCH 29.8 26.0 - 34.0 pg   MCHC 33.8 30.0 - 36.0 g/dL   RDW 12.9 11.5 - 15.5 %   Platelets 106 (L) 150 - 400 K/uL    Comment: CONSISTENT WITH PREVIOUS RESULT  Basic metabolic panel     Status: Abnormal   Collection Time: 12/27/14  3:17 AM  Result Value Ref Range   Sodium 140 135 - 145 mmol/L   Potassium 3.6 3.5 - 5.1 mmol/L   Chloride 110 101 - 111 mmol/L   CO2 24 22 - 32 mmol/L   Glucose, Bld 100 (H) 65 - 99 mg/dL   BUN 7 6 - 20 mg/dL   Creatinine, Ser 0.56 0.44 - 1.00 mg/dL   Calcium 8.5 (L) 8.9 - 10.3 mg/dL   GFR calc non Af Amer >60 >60 mL/min   GFR calc Af Amer >60 >60 mL/min    Comment: (NOTE) The eGFR has been calculated using the CKD EPI equation. This calculation has not been validated in all clinical situations. eGFR's persistently <60 mL/min signify possible Chronic Kidney Disease.    Anion gap 6 5 - 15     Lipid Panel  No results found for: CHOL, TRIG, HDL, CHOLHDL, VLDL, LDLCALC, LDLDIRECT   No results found for: HGBA1C   Lab Results  Component Value Date   CREATININE 0.56 12/27/2014     HPI :81 year old female with a history  of Alzheimer's dementia, does not provide any meaningful history, presents to the ER  after she was noted to be weak this evening when she attempted to walk to the bathroom. Patient was laid down on the floor after she became weak. She did not fall. She did not lose consciousness. Patient was brought in by husband, on her way to the ED the patient had one episode of bloody emesis. Blood pressure was noted to be 79/47. Patient reportedly did not have any bleeding at home. As per her home medication list the patient is on Naprosyn. 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 This polyp was removed. Patient's daughter, grandson are by the bedside, who are not patient's primary caregivers, and they are not able to provide any further history.  HOSPITAL COURSE: Probable Upper GI bleeding-status post EGD today, bleeding  likely from a gastric ulcer as per endoscopy report Likely in the setting of NSAID use, husband confirms that the patient takes Aleve every now and then Endoscopic appearance of the ulcer would suggest very low risk of rebleeding If no bleeding overnight and if hemoglobin stable that the patient is anticipated to be discharged tomorrow Gastroenterology has signed off Hemoglobin stable after one unit of packed red blood cells 2 days-hemoglobin stable around 9.4-9.93 days Protonix 40 mg po bid x 8 weeks, then 40 mg po qd thereafter indefinitely. Follow-up with the [facility] gastroenterology as needed    Alzheimer's dementia-patient does not take any medications for this  DO NOT RESUSCITATE, husband is the POA, Cromes,Bruce, discussed plan of care with him by the bedside  Discharge Exam:Blood pressure 144/67, pulse 69, temperature 97.6 F (36.4 C), temperature source Oral, resp. rate 18, height 5' 6"  (1.676 m), weight 66.4 kg (146 lb 6.2 oz), SpO2 96 %.  General: Awake but cannot communicate Lungs: Clear to auscultation bilaterally without wheezes or crackles Cardiovascular: Regular  rate and rhythm without murmur gallop or rub normal S1 and S2 Abdomen: Nontender, nondistended, soft, bowel sounds positive, no rebound, no ascites, no appreciable mass Extremities: No significant cyanosis, clubbing, or edema bilateral lower extremities       Signed: Yosmar Ryker 12/27/2014, 10:12 AM        Time spent >45 mins

## 2014-12-27 NOTE — Progress Notes (Signed)
OT Cancellation Note  Patient Details Name: Marcheta GrammesShirley N Meder MRN: [id number] DOB: 12/07/1925   Cancelled Treatment:    Reason Eval/Treat Not Completed: OT screened, no needs identified, will sign off  Angelene GiovanniConarpe, Lachlan Mckim M  Lauri Till Basileonarpe, OTR/L [phone]  12/27/2014, 10:49 AM

## 2014-12-27 NOTE — Progress Notes (Signed)
PT Cancellation Note  Patient Details Name: Norma Daugherty MRN: [id number] DOB: May 13, 1925   Cancelled Treatment:    Reason Eval/Treat Not Completed: Other (comment) Per RN, pt being discharged this AM and per family, they have no needs. They have 24/7 S and all necessary equipment. Will sign off for now. Thanks for the consult.   Blake DivineShauna A Corrina Steffensen 12/27/2014, 10:25 AM Mylo RedShauna Natha Guin, PT, DPT [phone]

## 2015-01-07 NOTE — Discharge Summary (Signed)
Physician Discharge Summary  Norma Daugherty MRN: [id number] DOB/AGE: 79-Aug-1927 79 y.o.  PCP: No primary care provider on file.   Admit date: 2014/12/31 Discharge date: 01/07/2015  Discharge Diagnoses:     Active Problems:   Upper GI bleeding   Alzheimer's dementia    Follow-up recommendations Follow-up with PCP in 3-5 days , including all  additional recommended appointments as below Follow-up CBC, CMP in 3-5 days follow-up with [facility] as outpatient on an as-needed basis.     Medication List    STOP taking these medications        naproxen sodium 220 MG tablet  Commonly known as:  ANAPROX     OVER THE COUNTER MEDICATION      TAKE these medications        NARCOSOFT HERBAL LAX PO  Take 1 tablet by mouth daily.     pantoprazole 40 MG tablet  Commonly known as:  PROTONIX  Take 1 tablet (40 mg total) by mouth 2 (two) times daily.     psyllium 58.6 % powder  Commonly known as:  METAMUCIL  Take 1 packet by mouth daily.         Discharge Condition: Stable Discharge Instructions   Discharge Instructions    Diet - low sodium heart healthy    Complete by:  As directed      Increase activity slowly    Complete by:  As directed            Allergies  Allergen Reactions  . Morphine And Related Nausea And Vomiting      Disposition: 01-Home or Self Care   Consults:  Gastroenterology   Significant Diagnostic Studies:  Portable Chest 1 View  2014-12-31  CLINICAL DATA:  81 year old female with weakness, vomiting and hypotension for 1 day. EXAM: PORTABLE CHEST 1 VIEW COMPARISON:  Chest x-ray 11/06/2008. FINDINGS: Irregular opacities at the left lung base, concerning for pneumonia. Right lung is clear. Small left pleural effusion. No evidence of pulmonary edema. Heart size is normal. Upper mediastinal contours are within normal limits. Atherosclerosis in the thoracic aorta. New right shoulder arthroplasty noted but incompletely visualized.  IMPRESSION: 1. Appearance the chest is concerning for left lower lobe pneumonia with small left parapneumonic pleural effusion. Followup PA and lateral chest X-ray is recommended in 3-4 weeks following trial of antibiotic therapy to ensure resolution and exclude underlying malignancy. 2. Atherosclerosis. Electronically Signed   By: Trudie Reed M.D.   On: 31-Dec-2014 19:49        Filed Weights   Dec 31, 2014 2017  Weight: 66.4 kg (146 lb 6.2 oz)     Microbiology: No results found for this or any previous visit (from the past 240 hour(s)).     Blood Culture No results found for: SDES, SPECREQUEST, CULT, REPTSTATUS    Labs: No results found for this or any previous visit (from the past 48 hour(s)).   Lipid Panel  No results found for: CHOL, TRIG, HDL, CHOLHDL, VLDL, LDLCALC, LDLDIRECT   No results found for: HGBA1C   Lab Results  Component Value Date   CREATININE 0.56 12/27/2014     HPI :81 year old female with a history of Alzheimer's dementia, does not provide any meaningful history, presents to the ER after she was noted to be weak this evening when she attempted to walk to the bathroom. Patient was laid down on the floor after she became weak. She did not fall. She did not lose consciousness. Patient was brought in by husband,  on her way to the ED the patient had one episode of bloody emesis. Blood pressure was noted to be 79/47. Patient reportedly did not have any bleeding at home. As per her home medication list the patient is on Naprosyn. 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 This polyp was removed. Patient's daughter, grandson are by the bedside, who are not patient's primary caregivers, and they are not able to provide any further history.  HOSPITAL COURSE: Probable Upper GI bleeding-status post EGD today, bleeding  likely from a gastric ulcer as per endoscopy report Likely  in the setting of NSAID use, husband confirms that the patient takes Aleve every now and then Endoscopic appearance of the ulcer would suggest very low risk of rebleeding If no bleeding overnight and if hemoglobin stable that the patient is anticipated to be discharged tomorrow Gastroenterology has signed off Hemoglobin stable after one unit of packed red blood cells 2 days-hemoglobin stable around 9.4-9.93 days Protonix 40 mg po bid x 8 weeks, then 40 mg po qd thereafter indefinitely. [facility]    Alzheimer's dementia-patient does not take any medications for this  DO NOT RESUSCITATE, husband is the POA, Cromes,Bruce, discussed plan of care with him by the bedside  Discharge Exam:Blood pressure 144/67, pulse 69, temperature 97.6 F (36.4 C), temperature source Oral, resp. rate 18, height 5\' 6"  (1.676 m), weight 66.4 kg (146 lb 6.2 oz), SpO2 96 %.  General: Awake but cannot communicate Lungs: Clear to auscultation bilaterally without wheezes or crackles Cardiovascular: Regular rate and rhythm without murmur gallop or rub normal S1 and S2 Abdomen: Nontender, nondistended, soft, bowel sounds positive, no rebound, no ascites, no appreciable mass Extremities: No significant cyanosis, clubbing, or edema bilateral lower extremities       Signed: Donnell Beauchamp 01/07/2015, 8:27 PM        Time spent >45 mins

## 2015-02-12 ENCOUNTER — Ambulatory Visit: Payer: Medicare Other | Admitting: Internal Medicine

## 2015-02-19 ENCOUNTER — Encounter: Payer: Self-pay | Admitting: Internal Medicine

## 2015-02-19 ENCOUNTER — Ambulatory Visit (INDEPENDENT_AMBULATORY_CARE_PROVIDER_SITE_OTHER): Payer: Medicare Other | Admitting: Internal Medicine

## 2015-02-19 VITALS — HR 98 | Temp 96.4°F | Resp 20 | Ht 62.5 in | Wt 143.6 lb

## 2015-02-19 DIAGNOSIS — F0280 Dementia in other diseases classified elsewhere without behavioral disturbance: Secondary | ICD-10-CM

## 2015-02-19 DIAGNOSIS — K2970 Gastritis, unspecified, without bleeding: Secondary | ICD-10-CM

## 2015-02-19 DIAGNOSIS — K259 Gastric ulcer, unspecified as acute or chronic, without hemorrhage or perforation: Secondary | ICD-10-CM

## 2015-02-19 DIAGNOSIS — R55 Syncope and collapse: Secondary | ICD-10-CM

## 2015-02-19 DIAGNOSIS — M810 Age-related osteoporosis without current pathological fracture: Secondary | ICD-10-CM | POA: Diagnosis not present

## 2015-02-19 DIAGNOSIS — D649 Anemia, unspecified: Secondary | ICD-10-CM

## 2015-02-19 DIAGNOSIS — G309 Alzheimer's disease, unspecified: Secondary | ICD-10-CM | POA: Diagnosis not present

## 2015-02-19 MED ORDER — OMEPRAZOLE 40 MG PO CPDR: 40.0000 mg | DELAYED_RELEASE_CAPSULE | Freq: Every day | ORAL | Status: DC

## 2015-02-19 NOTE — Progress Notes (Signed)
Patient ID: Norma Daugherty, female   DOB: [birth year], 82 y.o.   MRN: [id number]    Location:    [facility]   Place of Service:  OFFICE    Advanced Directive information Does patient have an advance directive?: Yes  Chief Complaint  Patient presents with  . Establish Care    New Patient Establish Care  . OTHER    Husband and care giver in room with patient    HPI:  82 yo female seen today as a new pt. She was seeing [facility] assessment center. She had a bleeding ulcer in Nov that stopped on its own. She had EGD showing gastric ulcer that did not show signs of rebleed. tx with BID PPI x 8 weeks and now taking daily   Dementia - x 10 yrs. has tried herbal supplements in past. Has never tried rx meds  Frequent syncopal episodes - improved since family stopped STEM cell supplements and other supplements. Last one on Jan 07, 2015  Gastric ulcer/NSAID induced gastritis - on protonix daily indefinitely. No further bleeding  Osteoporosis - takes supplements  Temporal arteritis hx - no sx's since 2010. tx with prednisone  Pt is a poor historian due to dementia. Hx obtained from family and chart. She has not seen a PCP in several months. Lives with spouse and dog. Caretaker present Mon-Fri 8a-5p  Past Medical History  Diagnosis Date  . Alzheimer's dementia   . Low blood pressure   . Stomach ulcer   . Alzheimer disease   . Osteoporosis   . Temporal arteritis [facility])     Past Surgical History  Procedure Laterality Date  . Esophagogastroduodenoscopy Left 12/26/2014    Procedure: ESOPHAGOGASTRODUODENOSCOPY (EGD);  Surgeon: Arta Silence, MD;  Location: [facility] ENDOSCOPY;  Service: Endoscopy;  Laterality: Left;    Patient Care Team: Gildardo Cranker, DO as PCP - General (Internal Medicine) Druscilla Brownie, MD as Consulting Physician (Dermatology) Hae Derrek Gu, MD as Referring Physician (Neurology)  Social History   Social History  . Marital Status: Widowed    Spouse  Name: N/A  . Number of Children: N/A  . Years of Education: N/A   Occupational History  . Not on file.   Social History Main Topics  . Smoking status: Never Smoker   . Smokeless tobacco: Never Used  . Alcohol Use: No  . Drug Use: No  . Sexual Activity: Not on file   Other Topics Concern  . Not on file   Social History Narrative   Diet:       Do you drink/ eat things with caffeine?Yes      Marital status: Married                           What year were you married ? 2003      Do you live in a house, apartment,assistred living, condo, trailer, etc.)? House       Is it one or more stories? One      How many persons live in your home ? 2      Do you have any pets in your home ?(please list) 1 Dog      Current or past profession:Office Manager        Do you exercise?    No                         Type & how  often:       Do you have a living will? Yes      Do you have a DNR form?     ?                  If not, do you want to discuss one? Yes       Do you have signed POA?HPOA forms? Yes                If so, please bring to your        appointment           reports that she has never smoked. She has never used smokeless tobacco. She reports that she does not drink alcohol or use illicit drugs.  History reviewed. No pertinent family history. Family Status  Relation Status Death Age  . Father Deceased   . Mother Deceased   . Sister Deceased   . Brother Deceased   . Sister Deceased   . Daughter Alive   . Daughter Alive   . Daughter Deceased     Immunization History  Administered Date(s) Administered  . Influenza,inj,Quad PF,36+ Mos 12/27/2014  . Pneumococcal Polysaccharide-23 12/27/2014    Allergies  Allergen Reactions  . Morphine And Related Nausea And Vomiting    Medications: Patient's Medications  New Prescriptions   No medications on file  Previous Medications   BISACODYL (LAXATIVE PO)    Take by mouth. Swiss Herbal Laxative take 2 capsules by  mouth daily   NUTRITIONAL SUPPLEMENTS (OSTEO ADVANCE PO)    Take by mouth. Take 2 table spoons by mouth daily   OMEGA 3-6-9 CAPS    Take by mouth. Take 2, 1000 mg capsules by mouth daily   PANTOPRAZOLE (PROTONIX) 40 MG TABLET    Take 1 tablet (40 mg total) by mouth 2 (two) times daily.   PSYLLIUM (METAMUCIL) 58.6 % POWDER    Take 1 packet by mouth daily.    TRACE MIN CACRCUFEKMGMNPSEZN (MINERALS PO)    Take by mouth. Alpha Mineral Support, take 1 teaspoon by mouth daily   VITAMIN E/D-ALPHA NATURAL PO    Take by mouth. Alpha Vitamin Support, Take 1 teaspoon by mouth daily  Modified Medications   No medications on file  Discontinued Medications   No medications on file    Review of Systems  Unable to perform ROS: Dementia  Gastrointestinal:       Hx bleeding gastric ulcer in Nov 016  Neurological: Positive for tremors and headaches.       Memory loss; loss of balance  taken from new pt packet  Filed Vitals:   02/19/15 1102  Pulse: 98  Temp: 96.4 F (35.8 C)  TempSrc: Axillary  Resp: 20  Height: 5' 2.5" (1.588 m)  Weight: 143 lb 9.6 oz (65.137 kg)  SpO2: 83%   Body mass index is 25.83 kg/(m^2).  Physical Exam  Constitutional: She appears well-developed.  Frail appearing in NAD  HENT:  Mouth/Throat: Oropharynx is clear and moist. No oropharyngeal exudate.  Eyes: Pupils are equal, round, and reactive to light. No scleral icterus.  Neck: Neck supple. Carotid bruit is not present. No tracheal deviation present. No thyromegaly present.  Cardiovascular: Normal rate, regular rhythm, normal heart sounds and intact distal pulses.  Exam reveals no gallop and no friction rub.   No murmur heard. No LE edema b/l. no calf TTP.   Pulmonary/Chest: Effort normal and breath sounds normal. No stridor. No respiratory distress. She  has no wheezes. She has no rales.  Abdominal: Soft. Bowel sounds are normal. She exhibits no distension and no mass. There is no hepatomegaly. There is no tenderness.  There is no rebound and no guarding.  Musculoskeletal:  Gait unsteady  Lymphadenopathy:    She has no cervical adenopathy.  Neurological: She is alert.  Skin: Skin is warm and dry. No rash noted.  Psychiatric: She has a normal mood and affect. Her behavior is normal.     Labs reviewed: Admission on 12/24/2014, Discharged on 12/27/2014  Component Date Value Ref Range Status  . Sodium 12/24/2014 141  135 - 145 mmol/L Final  . Potassium 12/24/2014 4.0  3.5 - 5.1 mmol/L Final  . Chloride 12/24/2014 104  101 - 111 mmol/L Final  . BUN 12/24/2014 57* 6 - 20 mg/dL Final  . Creatinine, Ser 12/24/2014 0.70  0.44 - 1.00 mg/dL Final  . Glucose, Bld 12/24/2014 126* 65 - 99 mg/dL Final  . Calcium, Ion 12/24/2014 1.28  1.13 - 1.30 mmol/L Final  . TCO2 12/24/2014 24  0 - 100 mmol/L Final  . Hemoglobin 12/24/2014 10.5* 12.0 - 15.0 g/dL Final  . HCT 12/24/2014 31.0* 36.0 - 46.0 % Final  . Sodium 12/24/2014 140  135 - 145 mmol/L Final  . Potassium 12/24/2014 4.0  3.5 - 5.1 mmol/L Final  . Chloride 12/24/2014 107  101 - 111 mmol/L Final  . CO2 12/24/2014 24  22 - 32 mmol/L Final  . Glucose, Bld 12/24/2014 137* 65 - 99 mg/dL Final  . BUN 12/24/2014 54* 6 - 20 mg/dL Final  . Creatinine, Ser 12/24/2014 0.69  0.44 - 1.00 mg/dL Final  . Calcium 12/24/2014 9.2  8.9 - 10.3 mg/dL Final  . Total Protein 12/24/2014 5.7* 6.5 - 8.1 g/dL Final  . Albumin 12/24/2014 2.8* 3.5 - 5.0 g/dL Final  . AST 12/24/2014 17  15 - 41 U/L Final  . ALT 12/24/2014 13* 14 - 54 U/L Final  . Alkaline Phosphatase 12/24/2014 52  38 - 126 U/L Final  . Total Bilirubin 12/24/2014 0.7  0.3 - 1.2 mg/dL Final  . GFR calc non Af Amer 12/24/2014 >60  >60 mL/min Final  . GFR calc Af Amer 12/24/2014 >60  >60 mL/min Final   Comment: (NOTE) The eGFR has been calculated using the CKD EPI equation. This calculation has not been validated in all clinical situations. eGFR's persistently <60 mL/min signify possible Chronic Kidney Disease.     . Anion gap 12/24/2014 9  5 - 15 Final  . WBC 12/24/2014 6.3  4.0 - 10.5 K/uL Final  . RBC 12/24/2014 3.49* 3.87 - 5.11 MIL/uL Final  . Hemoglobin 12/24/2014 10.7* 12.0 - 15.0 g/dL Final  . HCT 12/24/2014 32.6* 36.0 - 46.0 % Final  . MCV 12/24/2014 93.4  78.0 - 100.0 fL Final  . MCH 12/24/2014 30.7  26.0 - 34.0 pg Final  . MCHC 12/24/2014 32.8  30.0 - 36.0 g/dL Final  . RDW 12/24/2014 12.5  11.5 - 15.5 % Final  . Platelets 12/24/2014 157  150 - 400 K/uL Final  . Neutrophils Relative % 12/24/2014 64   Final  . Neutro Abs 12/24/2014 4.0  1.7 - 7.7 K/uL Final  . Lymphocytes Relative 12/24/2014 32   Final  . Lymphs Abs 12/24/2014 2.0  0.7 - 4.0 K/uL Final  . Monocytes Relative 12/24/2014 3   Final  . Monocytes Absolute 12/24/2014 0.2  0.1 - 1.0 K/uL Final  . Eosinophils Relative 12/24/2014 1  Final  . Eosinophils Absolute 12/24/2014 0.1  0.0 - 0.7 K/uL Final  . Basophils Relative 12/24/2014 0   Final  . Basophils Absolute 12/24/2014 0.0  0.0 - 0.1 K/uL Final  . Prothrombin Time 12/24/2014 15.2  11.6 - 15.2 seconds Final  . INR 12/24/2014 1.18  0.00 - 1.49 Final  . Blood Bank Specimen 12/24/2014 SAMPLE AVAILABLE FOR TESTING   Final  . Sample Expiration 12/24/2014 12/25/2014   Final  . Fecal Occult Bld 12/24/2014 POSITIVE* NEGATIVE Final  . Total Protein 12/24/2014 5.6* 6.5 - 8.1 g/dL Final  . Albumin 12/24/2014 2.8* 3.5 - 5.0 g/dL Final  . AST 12/24/2014 15  15 - 41 U/L Final  . ALT 12/24/2014 12* 14 - 54 U/L Final  . Alkaline Phosphatase 12/24/2014 49  38 - 126 U/L Final  . Total Bilirubin 12/24/2014 0.6  0.3 - 1.2 mg/dL Final  . Bilirubin, Direct 12/24/2014 <0.1* 0.1 - 0.5 mg/dL Final  . Indirect Bilirubin 12/24/2014 NOT CALCULATED  0.3 - 0.9 mg/dL Final  . Magnesium 12/24/2014 2.0  1.7 - 2.4 mg/dL Final  . Troponin I 12/24/2014 <0.03  <0.031 ng/mL Final   Comment:        NO INDICATION OF MYOCARDIAL INJURY.   . Troponin I 12/25/2014 <0.03  <0.031 ng/mL Final   Comment:         NO INDICATION OF MYOCARDIAL INJURY.   . WBC 12/25/2014 6.3  4.0 - 10.5 K/uL Final  . RBC 12/25/2014 3.24* 3.87 - 5.11 MIL/uL Final  . Hemoglobin 12/25/2014 9.8* 12.0 - 15.0 g/dL Final  . HCT 12/25/2014 29.2* 36.0 - 46.0 % Final  . MCV 12/25/2014 90.1  78.0 - 100.0 fL Final  . MCH 12/25/2014 30.2  26.0 - 34.0 pg Final  . MCHC 12/25/2014 33.6  30.0 - 36.0 g/dL Final  . RDW 12/25/2014 13.3  11.5 - 15.5 % Final  . Platelets 12/25/2014 115* 150 - 400 K/uL Final   Comment: REPEATED TO VERIFY PLATELET COUNT CONFIRMED BY SMEAR   . Sodium 12/25/2014 141  135 - 145 mmol/L Final  . Potassium 12/25/2014 3.9  3.5 - 5.1 mmol/L Final  . Chloride 12/25/2014 113* 101 - 111 mmol/L Final  . CO2 12/25/2014 23  22 - 32 mmol/L Final  . Glucose, Bld 12/25/2014 96  65 - 99 mg/dL Final  . BUN 12/25/2014 33* 6 - 20 mg/dL Final  . Creatinine, Ser 12/25/2014 0.65  0.44 - 1.00 mg/dL Final  . Calcium 12/25/2014 8.4* 8.9 - 10.3 mg/dL Final  . Total Protein 12/25/2014 5.3* 6.5 - 8.1 g/dL Final  . Albumin 12/25/2014 2.6* 3.5 - 5.0 g/dL Final  . AST 12/25/2014 15  15 - 41 U/L Final  . ALT 12/25/2014 11* 14 - 54 U/L Final  . Alkaline Phosphatase 12/25/2014 44  38 - 126 U/L Final  . Total Bilirubin 12/25/2014 1.2  0.3 - 1.2 mg/dL Final  . GFR calc non Af Amer 12/25/2014 >60  >60 mL/min Final  . GFR calc Af Amer 12/25/2014 >60  >60 mL/min Final   Comment: (NOTE) The eGFR has been calculated using the CKD EPI equation. This calculation has not been validated in all clinical situations. eGFR's persistently <60 mL/min signify possible Chronic Kidney Disease.   . Anion gap 12/25/2014 5  5 - 15 Final  . WBC 12/24/2014 7.9  4.0 - 10.5 K/uL Final  . RBC 12/24/2014 3.08* 3.87 - 5.11 MIL/uL Final  . Hemoglobin 12/24/2014 9.4* 12.0 - 15.0  g/dL Final  . HCT 12/24/2014 28.3* 36.0 - 46.0 % Final  . MCV 12/24/2014 91.9  78.0 - 100.0 fL Final  . MCH 12/24/2014 30.5  26.0 - 34.0 pg Final  . MCHC 12/24/2014 33.2  30.0 -  36.0 g/dL Final  . RDW 12/24/2014 12.4  11.5 - 15.5 % Final  . Platelets 12/24/2014 130* 150 - 400 K/uL Final  . ABO/RH(D) 12/24/2014 A POS   Final  . Antibody Screen 12/24/2014 NEG   Final  . Sample Expiration 12/24/2014 12/27/2014   Final  . Unit Number 12/24/2014 [id number]   Final  . Blood Component Type 12/24/2014 RED CELLS,LR   Final  . Unit division 12/24/2014 00   Final  . Status of Unit 12/24/2014 ISSUED,FINAL   Final  . Transfusion Status 12/24/2014 OK TO TRANSFUSE   Final  . Crossmatch Result 12/24/2014 Compatible   Final  . Order Confirmation 12/24/2014 ORDER PROCESSED BY BLOOD BANK   Final  . MRSA by PCR 12/24/2014 NEGATIVE  NEGATIVE Final   Comment:        The GeneXpert MRSA Assay (FDA approved for NASAL specimens only), is one component of a comprehensive MRSA colonization surveillance program. It is not intended to diagnose MRSA infection nor to guide or monitor treatment for MRSA infections.   . ABO/RH(D) 12/24/2014 A POS   Final  . WBC 12/26/2014 4.5  4.0 - 10.5 K/uL Final  . RBC 12/26/2014 3.22* 3.87 - 5.11 MIL/uL Final  . Hemoglobin 12/26/2014 9.7* 12.0 - 15.0 g/dL Final  . HCT 12/26/2014 28.7* 36.0 - 46.0 % Final  . MCV 12/26/2014 89.1  78.0 - 100.0 fL Final  . MCH 12/26/2014 30.1  26.0 - 34.0 pg Final  . MCHC 12/26/2014 33.8  30.0 - 36.0 g/dL Final  . RDW 12/26/2014 13.4  11.5 - 15.5 % Final  . Platelets 12/26/2014 107* 150 - 400 K/uL Final   CONSISTENT WITH PREVIOUS RESULT  . Sodium 12/26/2014 135  135 - 145 mmol/L Final  . Potassium 12/26/2014 3.7  3.5 - 5.1 mmol/L Final  . Chloride 12/26/2014 107  101 - 111 mmol/L Final  . CO2 12/26/2014 22  22 - 32 mmol/L Final  . Glucose, Bld 12/26/2014 104* 65 - 99 mg/dL Final  . BUN 12/26/2014 12  6 - 20 mg/dL Final  . Creatinine, Ser 12/26/2014 0.63  0.44 - 1.00 mg/dL Final  . Calcium 12/26/2014 8.4* 8.9 - 10.3 mg/dL Final  . Total Protein 12/26/2014 5.1* 6.5 - 8.1 g/dL Final  . Albumin 12/26/2014 2.6* 3.5  - 5.0 g/dL Final  . AST 12/26/2014 16  15 - 41 U/L Final  . ALT 12/26/2014 12* 14 - 54 U/L Final  . Alkaline Phosphatase 12/26/2014 47  38 - 126 U/L Final  . Total Bilirubin 12/26/2014 0.9  0.3 - 1.2 mg/dL Final  . GFR calc non Af Amer 12/26/2014 >60  >60 mL/min Final  . GFR calc Af Amer 12/26/2014 >60  >60 mL/min Final   Comment: (NOTE) The eGFR has been calculated using the CKD EPI equation. This calculation has not been validated in all clinical situations. eGFR's persistently <60 mL/min signify possible Chronic Kidney Disease.   . Anion gap 12/26/2014 6  5 - 15 Final  . WBC 12/27/2014 4.9  4.0 - 10.5 K/uL Final  . RBC 12/27/2014 3.32* 3.87 - 5.11 MIL/uL Final  . Hemoglobin 12/27/2014 9.9* 12.0 - 15.0 g/dL Final  . HCT 12/27/2014 29.3* 36.0 - 46.0 % Final  .  MCV 12/27/2014 88.3  78.0 - 100.0 fL Final  . MCH 12/27/2014 29.8  26.0 - 34.0 pg Final  . MCHC 12/27/2014 33.8  30.0 - 36.0 g/dL Final  . RDW 12/27/2014 12.9  11.5 - 15.5 % Final  . Platelets 12/27/2014 106* 150 - 400 K/uL Final   CONSISTENT WITH PREVIOUS RESULT  . Sodium 12/27/2014 140  135 - 145 mmol/L Final  . Potassium 12/27/2014 3.6  3.5 - 5.1 mmol/L Final  . Chloride 12/27/2014 110  101 - 111 mmol/L Final  . CO2 12/27/2014 24  22 - 32 mmol/L Final  . Glucose, Bld 12/27/2014 100* 65 - 99 mg/dL Final  . BUN 12/27/2014 7  6 - 20 mg/dL Final  . Creatinine, Ser 12/27/2014 0.56  0.44 - 1.00 mg/dL Final  . Calcium 12/27/2014 8.5* 8.9 - 10.3 mg/dL Final  . GFR calc non Af Amer 12/27/2014 >60  >60 mL/min Final  . GFR calc Af Amer 12/27/2014 >60  >60 mL/min Final   Comment: (NOTE) The eGFR has been calculated using the CKD EPI equation. This calculation has not been validated in all clinical situations. eGFR's persistently <60 mL/min signify possible Chronic Kidney Disease.   . Anion gap 12/27/2014 6  5 - 15 Final    No results found.   Assessment/Plan   ICD-9-CM ICD-10-CM   1. Alzheimer's dementia without  behavioral disturbance, unspecified timing of dementia onset - mod-sev 331.0 G30.9 CMP   294.10 F02.80 TSH  2. Gastric ulcer, unspecified chronicity - asymptomatic 531.90 K25.9 omeprazole (PRILOSEC) 40 MG capsule  3. Gastritis - stable 535.50 K29.70 omeprazole (PRILOSEC) 40 MG capsule  4. Syncope, unspecified syncope type - frequent 780.2 R55 CMP  5. Osteoporosis -stable 733.00 M81.0   6. Anemia, unspecified anemia type - stable 285.9 D64.9 CBC with Differential     Vitamin B12    Will call with lab results  Follow up in 4 weeks for dementia. Sample of Namzeric starter titration pak given to take 1 cap daily. May sprinkle on food  T/c neurology eval for syncope and r/o sz  Avoid NSAIDs  Continue other medications as ordered  Jaella Weinert S. Perlie Gold  [facility] [address] [place], [place] [address] [phone] Cell (Monday-Friday 8 AM - 5 PM) [phone] After 5 PM and follow prompts

## 2015-02-19 NOTE — Patient Instructions (Signed)
Will call with lab results  Follow up in 1 month for dementia  Continue other medications as ordered

## 2015-02-20 LAB — CBC WITH DIFFERENTIAL/PLATELET
BASOS ABS: 0 10*3/uL (ref 0.0–0.2)
Basos: 0 %
EOS (ABSOLUTE): 0.2 10*3/uL (ref 0.0–0.4)
Eos: 4 %
HEMATOCRIT: 39.3 % (ref 34.0–46.6)
HEMOGLOBIN: 13 g/dL (ref 11.1–15.9)
Immature Grans (Abs): 0 10*3/uL (ref 0.0–0.1)
Immature Granulocytes: 0 %
LYMPHS ABS: 1.9 10*3/uL (ref 0.7–3.1)
Lymphs: 35 %
MCH: 30 pg (ref 26.6–33.0)
MCHC: 33.1 g/dL (ref 31.5–35.7)
MCV: 91 fL (ref 79–97)
MONOCYTES: 8 %
Monocytes Absolute: 0.4 10*3/uL (ref 0.1–0.9)
NEUTROS ABS: 2.9 10*3/uL (ref 1.4–7.0)
Neutrophils: 53 %
Platelets: 163 10*3/uL (ref 150–379)
RBC: 4.33 x10E6/uL (ref 3.77–5.28)
RDW: 13.6 % (ref 12.3–15.4)
WBC: 5.4 10*3/uL (ref 3.4–10.8)

## 2015-02-20 LAB — COMPREHENSIVE METABOLIC PANEL
A/G RATIO: 1.2 (ref 1.1–2.5)
ALBUMIN: 3.8 g/dL (ref 3.5–4.7)
ALK PHOS: 74 IU/L (ref 39–117)
ALT: 11 IU/L (ref 0–32)
AST: 16 IU/L (ref 0–40)
BILIRUBIN TOTAL: 0.4 mg/dL (ref 0.0–1.2)
BUN / CREAT RATIO: 26 (ref 11–26)
BUN: 18 mg/dL (ref 8–27)
CHLORIDE: 103 mmol/L (ref 96–106)
CO2: 25 mmol/L (ref 18–29)
Calcium: 9.5 mg/dL (ref 8.7–10.3)
Creatinine, Ser: 0.7 mg/dL (ref 0.57–1.00)
GFR calc Af Amer: 90 mL/min/{1.73_m2} (ref >59)
GFR calc non Af Amer: 78 mL/min/{1.73_m2} (ref >59)
GLOBULIN, TOTAL: 3.2 g/dL (ref 1.5–4.5)
GLUCOSE: 86 mg/dL (ref 65–99)
POTASSIUM: 4.1 mmol/L (ref 3.5–5.2)
SODIUM: 145 mmol/L — AB (ref 134–144)
Total Protein: 7 g/dL (ref 6.0–8.5)

## 2015-02-20 LAB — TSH: TSH: 1.71 u[IU]/mL (ref 0.450–4.500)

## 2015-02-20 LAB — VITAMIN B12: VITAMIN B 12: 358 pg/mL (ref 211–946)

## 2015-02-25 ENCOUNTER — Other Ambulatory Visit: Payer: Self-pay | Admitting: *Deleted

## 2015-02-25 MED ORDER — CYANOCOBALAMIN 1000 MCG/15ML PO LIQD
ORAL | Status: DC
Start: 2015-02-25 — End: 2015-12-21

## 2015-03-21 ENCOUNTER — Ambulatory Visit (INDEPENDENT_AMBULATORY_CARE_PROVIDER_SITE_OTHER): Payer: Medicare Other | Admitting: Internal Medicine

## 2015-03-21 ENCOUNTER — Encounter: Payer: Self-pay | Admitting: Internal Medicine

## 2015-03-21 VITALS — BP 108/68 | HR 75 | Temp 97.4°F | Resp 20 | Ht 63.0 in | Wt 149.6 lb

## 2015-03-21 DIAGNOSIS — F0280 Dementia in other diseases classified elsewhere without behavioral disturbance: Secondary | ICD-10-CM | POA: Diagnosis not present

## 2015-03-21 DIAGNOSIS — E538 Deficiency of other specified B group vitamins: Secondary | ICD-10-CM

## 2015-03-21 DIAGNOSIS — G309 Alzheimer's disease, unspecified: Secondary | ICD-10-CM | POA: Diagnosis not present

## 2015-03-21 MED ORDER — MEMANTINE HCL-DONEPEZIL HCL ER 28-10 MG PO CP24: 1.0000 | ORAL_CAPSULE | Freq: Every day | ORAL | Status: DC

## 2015-03-21 NOTE — Patient Instructions (Signed)
Continue Namzaric daily  Follow up in 3 mos for routine visit  Check labs (nonfasting) prior to visit

## 2015-03-21 NOTE — Progress Notes (Signed)
Patient ID: Norma Daugherty, female   DOB: 1925-07-23, 82 y.o.   MRN: [id number]    Location:    [facility]   Place of Service:   OFFICE  Chief Complaint  Patient presents with  . Medical Management of Chronic Issues    1 month follow-up for Dementia  . OTHER    Husband and care in room with patient    HPI:  82 yo female seen today for f/u Alzheimer's dementia. She started namzaric last month. Constipation improved. No diarrhea or nightmares. She appears to be focus better on med.  Pt is a poor historian due to dementia. Hx obtained from spouse and caretaker. Lives with spouse and dog. Caretaker present Mon-Fri 8a-5p  Past Medical History  Diagnosis Date  . Alzheimer's dementia   . Low blood pressure   . Stomach ulcer   . Alzheimer disease   . Osteoporosis   . Temporal arteritis ([place])   . External hemorrhoids 2015  . Constipation     Past Surgical History  Procedure Laterality Date  . Esophagogastroduodenoscopy Left 12/26/2014    Procedure: ESOPHAGOGASTRODUODENOSCOPY (EGD);  Surgeon: Arta Silence, MD;  Location: [facility] ENDOSCOPY;  Service: Endoscopy;  Laterality: Left;    Patient Care Team: Gildardo Cranker, DO as PCP - General (Internal Medicine) Druscilla Brownie, MD as Consulting Physician (Dermatology) Hae Derrek Gu, MD as Referring Physician (Neurology)  Social History   Social History  . Marital Status: Widowed    Spouse Name: N/A  . Number of Children: N/A  . Years of Education: N/A   Occupational History  . Not on file.   Social History Main Topics  . Smoking status: Never Smoker   . Smokeless tobacco: Never Used  . Alcohol Use: No  . Drug Use: No  . Sexual Activity: Not on file   Other Topics Concern  . Not on file   Social History Narrative   Diet:       Do you drink/ eat things with caffeine?Yes      Marital status: Married                           What year were you married ? 2003      Do you live in a house, apartment,assistred living, condo,  trailer, etc.)? House       Is it one or more stories? One      How many persons live in your home ? 2      Do you have any pets in your home ?(please list) 1 Dog      Current or past profession:Office Manager        Do you exercise?    No                         Type & how often:       Do you have a living will? Yes      Do you have a DNR form?     ?                  If not, do you want to discuss one? Yes       Do you have signed POA?HPOA forms? Yes                If so, please bring to your        appointment  reports that she has never smoked. She has never used smokeless tobacco. She reports that she does not drink alcohol or use illicit drugs.  Allergies  Allergen Reactions  . Morphine And Related Nausea And Vomiting    Medications: Patient's Medications  New Prescriptions   MEMANTINE HCL-DONEPEZIL HCL (NAMZARIC) 28-10 MG CP24    Take 1 capsule by mouth daily.  Previous Medications   BISACODYL (LAXATIVE PO)    Take by mouth. Swiss Herbal Laxative take 2 capsules by mouth daily   CYANOCOBALAMIN ([employer] B-12) 1000 MCG/15ML LIQD    Give Vit. B12 gtts 1015mg po daily   NUTRITIONAL SUPPLEMENTS (OSTEO ADVANCE PO)    Take by mouth. Take 2 table spoons by mouth daily   OMEGA 3-6-9 CAPS    Take by mouth. Take 2, 1000 mg capsules by mouth daily   OMEPRAZOLE (PRILOSEC) 40 MG CAPSULE    Take 1 capsule (40 mg total) by mouth daily.   PSYLLIUM (METAMUCIL) 58.6 % POWDER    Take 1 packet by mouth daily.    TRACE MIN CACRCUFEKMGMNPSEZN (MINERALS PO)    Take by mouth. Alpha Mineral Support, take 1 teaspoon by mouth daily   VITAMIN E/D-ALPHA NATURAL PO    Take by mouth. Alpha Vitamin Support, Take 1 teaspoon by mouth daily  Modified Medications   No medications on file  Discontinued Medications   No medications on file    Review of Systems  Unable to perform ROS: Dementia    Filed Vitals:   03/21/15 1048  BP: 108/68  Pulse: 75  Temp: 97.4 F (36.3 C)  TempSrc:  Axillary  Resp: 20  Height: 5' 3"  (1.6 m)  Weight: 149 lb 9.6 oz (67.858 kg)  SpO2: 95%   Body mass index is 26.51 kg/(m^2).  Physical Exam  Constitutional: She appears well-developed.  Frail appearing in NAD  Neck: Carotid bruit is not present.  Cardiovascular: Normal rate, regular rhythm, normal heart sounds and intact distal pulses.  Exam reveals no gallop and no friction rub.   No murmur heard. No LE edema b/l. no calf TTP.   Pulmonary/Chest: Effort normal and breath sounds normal. No respiratory distress. She has no wheezes. She has no rales.  Abdominal: There is no hepatomegaly.  Musculoskeletal: She exhibits edema.  Neurological: She is alert.  Resting tremor  Skin: Skin is warm and dry. No rash noted.  Psychiatric: She has a normal mood and affect. Her behavior is normal.     Labs reviewed: Office Visit on 02/19/2015  Component Date Value Ref Range Status  . WBC 02/19/2015 5.4  3.4 - 10.8 x10E3/uL Final  . RBC 02/19/2015 4.33  3.77 - 5.28 x10E6/uL Final  . Hemoglobin 02/19/2015 13.0  11.1 - 15.9 g/dL Final  . Hematocrit 02/19/2015 39.3  34.0 - 46.6 % Final  . MCV 02/19/2015 91  79 - 97 fL Final  . MCH 02/19/2015 30.0  26.6 - 33.0 pg Final  . MCHC 02/19/2015 33.1  31.5 - 35.7 g/dL Final  . RDW 02/19/2015 13.6  12.3 - 15.4 % Final  . Platelets 02/19/2015 163  150 - 379 x10E3/uL Final  . Neutrophils 02/19/2015 53   Final  . Lymphs 02/19/2015 35   Final  . Monocytes 02/19/2015 8   Final  . Eos 02/19/2015 4   Final  . Basos 02/19/2015 0   Final  . Neutrophils Absolute 02/19/2015 2.9  1.4 - 7.0 x10E3/uL Final  . Lymphocytes Absolute 02/19/2015 1.9  0.7 - 3.1  x10E3/uL Final  . Monocytes Absolute 02/19/2015 0.4  0.1 - 0.9 x10E3/uL Final  . EOS (ABSOLUTE) 02/19/2015 0.2  0.0 - 0.4 x10E3/uL Final  . Basophils Absolute 02/19/2015 0.0  0.0 - 0.2 x10E3/uL Final  . Immature Granulocytes 02/19/2015 0   Final  . Immature Grans (Abs) 02/19/2015 0.0  0.0 - 0.1 x10E3/uL Final  .  Glucose 02/19/2015 86  65 - 99 mg/dL Final  . BUN 02/19/2015 18  8 - 27 mg/dL Final  . Creatinine, Ser 02/19/2015 0.70  0.57 - 1.00 mg/dL Final  . GFR calc non Af Amer 02/19/2015 78  >59 mL/min/1.73 Final  . GFR calc Af Amer 02/19/2015 90  >59 mL/min/1.73 Final  . BUN/Creatinine Ratio 02/19/2015 26  11 - 26 Final  . Sodium 02/19/2015 145* 134 - 144 mmol/L Final  . Potassium 02/19/2015 4.1  3.5 - 5.2 mmol/L Final  . Chloride 02/19/2015 103  96 - 106 mmol/L Final  . CO2 02/19/2015 25  18 - 29 mmol/L Final  . Calcium 02/19/2015 9.5  8.7 - 10.3 mg/dL Final  . Total Protein 02/19/2015 7.0  6.0 - 8.5 g/dL Final  . Albumin 02/19/2015 3.8  3.5 - 4.7 g/dL Final  . Globulin, Total 02/19/2015 3.2  1.5 - 4.5 g/dL Final  . Albumin/Globulin Ratio 02/19/2015 1.2  1.1 - 2.5 Final  . Bilirubin Total 02/19/2015 0.4  0.0 - 1.2 mg/dL Final  . Alkaline Phosphatase 02/19/2015 74  39 - 117 IU/L Final  . AST 02/19/2015 16  0 - 40 IU/L Final  . ALT 02/19/2015 11  0 - 32 IU/L Final  . Vitamin B-12 02/19/2015 358  211 - 946 pg/mL Final  . TSH 02/19/2015 1.710  0.450 - 4.500 uIU/mL Final  Admission on 12/24/2014, Discharged on 12/27/2014  Component Date Value Ref Range Status  . Sodium 12/24/2014 141  135 - 145 mmol/L Final  . Potassium 12/24/2014 4.0  3.5 - 5.1 mmol/L Final  . Chloride 12/24/2014 104  101 - 111 mmol/L Final  . BUN 12/24/2014 57* 6 - 20 mg/dL Final  . Creatinine, Ser 12/24/2014 0.70  0.44 - 1.00 mg/dL Final  . Glucose, Bld 12/24/2014 126* 65 - 99 mg/dL Final  . Calcium, Ion 12/24/2014 1.28  1.13 - 1.30 mmol/L Final  . TCO2 12/24/2014 24  0 - 100 mmol/L Final  . Hemoglobin 12/24/2014 10.5* 12.0 - 15.0 g/dL Final  . HCT 12/24/2014 31.0* 36.0 - 46.0 % Final  . Sodium 12/24/2014 140  135 - 145 mmol/L Final  . Potassium 12/24/2014 4.0  3.5 - 5.1 mmol/L Final  . Chloride 12/24/2014 107  101 - 111 mmol/L Final  . CO2 12/24/2014 24  22 - 32 mmol/L Final  . Glucose, Bld 12/24/2014 137* 65 - 99 mg/dL  Final  . BUN 12/24/2014 54* 6 - 20 mg/dL Final  . Creatinine, Ser 12/24/2014 0.69  0.44 - 1.00 mg/dL Final  . Calcium 12/24/2014 9.2  8.9 - 10.3 mg/dL Final  . Total Protein 12/24/2014 5.7* 6.5 - 8.1 g/dL Final  . Albumin 12/24/2014 2.8* 3.5 - 5.0 g/dL Final  . AST 12/24/2014 17  15 - 41 U/L Final  . ALT 12/24/2014 13* 14 - 54 U/L Final  . Alkaline Phosphatase 12/24/2014 52  38 - 126 U/L Final  . Total Bilirubin 12/24/2014 0.7  0.3 - 1.2 mg/dL Final  . GFR calc non Af Amer 12/24/2014 >60  >60 mL/min Final  . GFR calc Af Amer 12/24/2014 >60  >  60 mL/min Final   Comment: (NOTE) The eGFR has been calculated using the CKD EPI equation. This calculation has not been validated in all clinical situations. eGFR's persistently <60 mL/min signify possible Chronic Kidney Disease.   . Anion gap 12/24/2014 9  5 - 15 Final  . WBC 12/24/2014 6.3  4.0 - 10.5 K/uL Final  . RBC 12/24/2014 3.49* 3.87 - 5.11 MIL/uL Final  . Hemoglobin 12/24/2014 10.7* 12.0 - 15.0 g/dL Final  . HCT 12/24/2014 32.6* 36.0 - 46.0 % Final  . MCV 12/24/2014 93.4  78.0 - 100.0 fL Final  . MCH 12/24/2014 30.7  26.0 - 34.0 pg Final  . MCHC 12/24/2014 32.8  30.0 - 36.0 g/dL Final  . RDW 12/24/2014 12.5  11.5 - 15.5 % Final  . Platelets 12/24/2014 157  150 - 400 K/uL Final  . Neutrophils Relative % 12/24/2014 64   Final  . Neutro Abs 12/24/2014 4.0  1.7 - 7.7 K/uL Final  . Lymphocytes Relative 12/24/2014 32   Final  . Lymphs Abs 12/24/2014 2.0  0.7 - 4.0 K/uL Final  . Monocytes Relative 12/24/2014 3   Final  . Monocytes Absolute 12/24/2014 0.2  0.1 - 1.0 K/uL Final  . Eosinophils Relative 12/24/2014 1   Final  . Eosinophils Absolute 12/24/2014 0.1  0.0 - 0.7 K/uL Final  . Basophils Relative 12/24/2014 0   Final  . Basophils Absolute 12/24/2014 0.0  0.0 - 0.1 K/uL Final  . Prothrombin Time 12/24/2014 15.2  11.6 - 15.2 seconds Final  . INR 12/24/2014 1.18  0.00 - 1.49 Final  . Blood Bank Specimen 12/24/2014 SAMPLE AVAILABLE  FOR TESTING   Final  . Sample Expiration 12/24/2014 12/25/2014   Final  . Fecal Occult Bld 12/24/2014 POSITIVE* NEGATIVE Final  . Total Protein 12/24/2014 5.6* 6.5 - 8.1 g/dL Final  . Albumin 12/24/2014 2.8* 3.5 - 5.0 g/dL Final  . AST 12/24/2014 15  15 - 41 U/L Final  . ALT 12/24/2014 12* 14 - 54 U/L Final  . Alkaline Phosphatase 12/24/2014 49  38 - 126 U/L Final  . Total Bilirubin 12/24/2014 0.6  0.3 - 1.2 mg/dL Final  . Bilirubin, Direct 12/24/2014 <0.1* 0.1 - 0.5 mg/dL Final  . Indirect Bilirubin 12/24/2014 NOT CALCULATED  0.3 - 0.9 mg/dL Final  . Magnesium 12/24/2014 2.0  1.7 - 2.4 mg/dL Final  . Troponin I 12/24/2014 <0.03  <0.031 ng/mL Final   Comment:        NO INDICATION OF MYOCARDIAL INJURY.   . Troponin I 12/25/2014 <0.03  <0.031 ng/mL Final   Comment:        NO INDICATION OF MYOCARDIAL INJURY.   . WBC 12/25/2014 6.3  4.0 - 10.5 K/uL Final  . RBC 12/25/2014 3.24* 3.87 - 5.11 MIL/uL Final  . Hemoglobin 12/25/2014 9.8* 12.0 - 15.0 g/dL Final  . HCT 12/25/2014 29.2* 36.0 - 46.0 % Final  . MCV 12/25/2014 90.1  78.0 - 100.0 fL Final  . MCH 12/25/2014 30.2  26.0 - 34.0 pg Final  . MCHC 12/25/2014 33.6  30.0 - 36.0 g/dL Final  . RDW 12/25/2014 13.3  11.5 - 15.5 % Final  . Platelets 12/25/2014 115* 150 - 400 K/uL Final   Comment: REPEATED TO VERIFY PLATELET COUNT CONFIRMED BY SMEAR   . Sodium 12/25/2014 141  135 - 145 mmol/L Final  . Potassium 12/25/2014 3.9  3.5 - 5.1 mmol/L Final  . Chloride 12/25/2014 113* 101 - 111 mmol/L Final  . CO2 12/25/2014  23  22 - 32 mmol/L Final  . Glucose, Bld 12/25/2014 96  65 - 99 mg/dL Final  . BUN 12/25/2014 33* 6 - 20 mg/dL Final  . Creatinine, Ser 12/25/2014 0.65  0.44 - 1.00 mg/dL Final  . Calcium 12/25/2014 8.4* 8.9 - 10.3 mg/dL Final  . Total Protein 12/25/2014 5.3* 6.5 - 8.1 g/dL Final  . Albumin 12/25/2014 2.6* 3.5 - 5.0 g/dL Final  . AST 12/25/2014 15  15 - 41 U/L Final  . ALT 12/25/2014 11* 14 - 54 U/L Final  . Alkaline  Phosphatase 12/25/2014 44  38 - 126 U/L Final  . Total Bilirubin 12/25/2014 1.2  0.3 - 1.2 mg/dL Final  . GFR calc non Af Amer 12/25/2014 >60  >60 mL/min Final  . GFR calc Af Amer 12/25/2014 >60  >60 mL/min Final   Comment: (NOTE) The eGFR has been calculated using the CKD EPI equation. This calculation has not been validated in all clinical situations. eGFR's persistently <60 mL/min signify possible Chronic Kidney Disease.   . Anion gap 12/25/2014 5  5 - 15 Final  . WBC 12/24/2014 7.9  4.0 - 10.5 K/uL Final  . RBC 12/24/2014 3.08* 3.87 - 5.11 MIL/uL Final  . Hemoglobin 12/24/2014 9.4* 12.0 - 15.0 g/dL Final  . HCT 12/24/2014 28.3* 36.0 - 46.0 % Final  . MCV 12/24/2014 91.9  78.0 - 100.0 fL Final  . MCH 12/24/2014 30.5  26.0 - 34.0 pg Final  . MCHC 12/24/2014 33.2  30.0 - 36.0 g/dL Final  . RDW 12/24/2014 12.4  11.5 - 15.5 % Final  . Platelets 12/24/2014 130* 150 - 400 K/uL Final  . ABO/RH(D) 12/24/2014 A POS   Final  . Antibody Screen 12/24/2014 NEG   Final  . Sample Expiration 12/24/2014 12/27/2014   Final  . Unit Number 12/24/2014 [id number]   Final  . Blood Component Type 12/24/2014 RED CELLS,LR   Final  . Unit division 12/24/2014 00   Final  . Status of Unit 12/24/2014 ISSUED,FINAL   Final  . Transfusion Status 12/24/2014 OK TO TRANSFUSE   Final  . Crossmatch Result 12/24/2014 Compatible   Final  . Order Confirmation 12/24/2014 ORDER PROCESSED BY BLOOD BANK   Final  . MRSA by PCR 12/24/2014 NEGATIVE  NEGATIVE Final   Comment:        The GeneXpert MRSA Assay (FDA approved for NASAL specimens only), is one component of a comprehensive MRSA colonization surveillance program. It is not intended to diagnose MRSA infection nor to guide or monitor treatment for MRSA infections.   . ABO/RH(D) 12/24/2014 A POS   Final  . WBC 12/26/2014 4.5  4.0 - 10.5 K/uL Final  . RBC 12/26/2014 3.22* 3.87 - 5.11 MIL/uL Final  . Hemoglobin 12/26/2014 9.7* 12.0 - 15.0 g/dL Final  . HCT  12/26/2014 28.7* 36.0 - 46.0 % Final  . MCV 12/26/2014 89.1  78.0 - 100.0 fL Final  . MCH 12/26/2014 30.1  26.0 - 34.0 pg Final  . MCHC 12/26/2014 33.8  30.0 - 36.0 g/dL Final  . RDW 12/26/2014 13.4  11.5 - 15.5 % Final  . Platelets 12/26/2014 107* 150 - 400 K/uL Final   CONSISTENT WITH PREVIOUS RESULT  . Sodium 12/26/2014 135  135 - 145 mmol/L Final  . Potassium 12/26/2014 3.7  3.5 - 5.1 mmol/L Final  . Chloride 12/26/2014 107  101 - 111 mmol/L Final  . CO2 12/26/2014 22  22 - 32 mmol/L Final  . Glucose, Bld 12/26/2014  104* 65 - 99 mg/dL Final  . BUN 12/26/2014 12  6 - 20 mg/dL Final  . Creatinine, Ser 12/26/2014 0.63  0.44 - 1.00 mg/dL Final  . Calcium 12/26/2014 8.4* 8.9 - 10.3 mg/dL Final  . Total Protein 12/26/2014 5.1* 6.5 - 8.1 g/dL Final  . Albumin 12/26/2014 2.6* 3.5 - 5.0 g/dL Final  . AST 12/26/2014 16  15 - 41 U/L Final  . ALT 12/26/2014 12* 14 - 54 U/L Final  . Alkaline Phosphatase 12/26/2014 47  38 - 126 U/L Final  . Total Bilirubin 12/26/2014 0.9  0.3 - 1.2 mg/dL Final  . GFR calc non Af Amer 12/26/2014 >60  >60 mL/min Final  . GFR calc Af Amer 12/26/2014 >60  >60 mL/min Final   Comment: (NOTE) The eGFR has been calculated using the CKD EPI equation. This calculation has not been validated in all clinical situations. eGFR's persistently <60 mL/min signify possible Chronic Kidney Disease.   . Anion gap 12/26/2014 6  5 - 15 Final  . WBC 12/27/2014 4.9  4.0 - 10.5 K/uL Final  . RBC 12/27/2014 3.32* 3.87 - 5.11 MIL/uL Final  . Hemoglobin 12/27/2014 9.9* 12.0 - 15.0 g/dL Final  . HCT 12/27/2014 29.3* 36.0 - 46.0 % Final  . MCV 12/27/2014 88.3  78.0 - 100.0 fL Final  . MCH 12/27/2014 29.8  26.0 - 34.0 pg Final  . MCHC 12/27/2014 33.8  30.0 - 36.0 g/dL Final  . RDW 12/27/2014 12.9  11.5 - 15.5 % Final  . Platelets 12/27/2014 106* 150 - 400 K/uL Final   CONSISTENT WITH PREVIOUS RESULT  . Sodium 12/27/2014 140  135 - 145 mmol/L Final  . Potassium 12/27/2014 3.6  3.5 -  5.1 mmol/L Final  . Chloride 12/27/2014 110  101 - 111 mmol/L Final  . CO2 12/27/2014 24  22 - 32 mmol/L Final  . Glucose, Bld 12/27/2014 100* 65 - 99 mg/dL Final  . BUN 12/27/2014 7  6 - 20 mg/dL Final  . Creatinine, Ser 12/27/2014 0.56  0.44 - 1.00 mg/dL Final  . Calcium 12/27/2014 8.5* 8.9 - 10.3 mg/dL Final  . GFR calc non Af Amer 12/27/2014 >60  >60 mL/min Final  . GFR calc Af Amer 12/27/2014 >60  >60 mL/min Final   Comment: (NOTE) The eGFR has been calculated using the CKD EPI equation. This calculation has not been validated in all clinical situations. eGFR's persistently <60 mL/min signify possible Chronic Kidney Disease.   . Anion gap 12/27/2014 6  5 - 15 Final    No results found.   Assessment/Plan   ICD-9-CM ICD-10-CM   1. Alzheimer's dementia without behavioral disturbance, unspecified timing of dementia onset 331.0 G30.9 Memantine HCl-Donepezil HCl (NAMZARIC) 28-10 MG CP24   294.10 F02.80   2.       B12 deficiency  Continue Namzaric daily. Rx sent to pharmacy  Follow up in 3 mos for routine visit  Check labs (nonfasting) prior to visit - BMP,  ALT and B12  Amdrew Oboyle S. Perlie Gold  [facility] [address] [place], [place] [address] [phone] Cell (Monday-Friday 8 AM - 5 PM) [phone] After 5 PM and follow prompts

## 2015-03-26 ENCOUNTER — Telehealth: Payer: Self-pay | Admitting: *Deleted

## 2015-03-26 NOTE — Telephone Encounter (Signed)
Patient husband, Smitty Cords called and stated that patient's Namzaric cost over $400, not covered by insurance. Initiated prior authorization through Cover My Meds Key:[id number]. Submitted and went into clinical review. Awaiting determination. Patient husband notified. ZO:[id number]

## 2015-04-16 ENCOUNTER — Telehealth: Payer: Self-pay

## 2015-04-16 MED ORDER — OSELTAMIVIR PHOSPHATE 75 MG PO CAPS: 75.0000 mg | ORAL_CAPSULE | Freq: Two times a day (BID) | ORAL | Status: DC

## 2015-04-16 MED ORDER — OSELTAMIVIR PHOSPHATE 75 MG PO CAPS
75.0000 mg | ORAL_CAPSULE | Freq: Two times a day (BID) | ORAL | Status: DC
Start: 2015-04-16 — End: 2015-04-17

## 2015-04-16 NOTE — Telephone Encounter (Signed)
RX sent to [facility] patient's son aware. Appointment cancelled

## 2015-04-16 NOTE — Telephone Encounter (Signed)
Tamiflu  #10 take 1 cap po BID x 5 days no rf; push fluids.  Call office if not feeling better

## 2015-04-16 NOTE — Telephone Encounter (Signed)
Message left on triage voicemail: Patient's son called stating patient has a fever and is acting "[place]ish" . Please call  I called patient's son, patient unable to check temp but patient feels very warm. Patient also very fatigued. Patient's son currently has the flu and gave his mom a dose of his Tamiflu. Patient is schedule to see Shanda Bumps tomorrow. Patient's son would like to know if patient really need to be seen or if Dr.Carter will call in Tamiflu  Please advise

## 2015-04-17 ENCOUNTER — Ambulatory Visit: Payer: Self-pay | Admitting: Nurse Practitioner

## 2015-04-17 ENCOUNTER — Ambulatory Visit (INDEPENDENT_AMBULATORY_CARE_PROVIDER_SITE_OTHER): Payer: Medicare Other | Admitting: Nurse Practitioner

## 2015-04-17 ENCOUNTER — Encounter: Payer: Self-pay | Admitting: Nurse Practitioner

## 2015-04-17 VITALS — BP 120/70 | HR 85 | Temp 97.4°F | Resp 20 | Ht 63.0 in | Wt 146.2 lb

## 2015-04-17 DIAGNOSIS — J111 Influenza due to unidentified influenza virus with other respiratory manifestations: Secondary | ICD-10-CM

## 2015-04-17 NOTE — Progress Notes (Signed)
Patient ID: Norma Daugherty, female   DOB: [birth year], 82 y.o.   MRN: [id number]    PCP: Kirt Boys, DO  Advanced Directive information Does patient have an advance directive?: Yes, Type of Advance Directive: Healthcare Power of Attorney, Does patient want to make changes to advanced directive?: No - Patient declined  Allergies  Allergen Reactions  . Morphine And Related Nausea And Vomiting    Chief Complaint  Patient presents with  . Acute Visit     HPI: Patient is a 82 y.o. female seen in the office today due to exposure to influenza. Pt here with son who is her caregiver who had influenza and was started on tamiflu. Pt started tamiflu yesterday per Dr Montez Morita.  Pt has been having congestion, runny nose, cough and slight fever for 2-3 days. Has been taking childrens motrin for fever.  Eating and drinking well.  Ongoing dry cough.   Review of Systems:  Review of Systems  Unable to perform ROS: Dementia    Past Medical History  Diagnosis Date  . Alzheimer's dementia   . Low blood pressure   . Stomach ulcer   . Alzheimer disease   . Osteoporosis   . Temporal arteritis ([facility])   . External hemorrhoids 2015  . Constipation    Past Surgical History  Procedure Laterality Date  . Esophagogastroduodenoscopy Left 12/26/2014    Procedure: ESOPHAGOGASTRODUODENOSCOPY (EGD);  Surgeon: Willis Modena, MD;  Location: [facility] ENDOSCOPY;  Service: Endoscopy;  Laterality: Left;   Social History:   reports that she has never smoked. She has never used smokeless tobacco. She reports that she does not drink alcohol or use illicit drugs.  No family history on file.  Medications: Patient's Medications  New Prescriptions   No medications on file  Previous Medications   BISACODYL (LAXATIVE PO)    Take by mouth. Swiss Herbal Laxative take 2 capsules by mouth daily   CYANOCOBALAMIN ([employer] B-12) 1000 MCG/15ML LIQD    Give Vit. B12 gtts po daily   MEMANTINE HCL-DONEPEZIL HCL  (NAMZARIC) 28-10 MG CP24    Take 1 capsule by mouth daily.   NUTRITIONAL SUPPLEMENTS (OSTEO ADVANCE PO)    Take by mouth. Take 2 table spoons by mouth daily   OMEGA 3-6-9 CAPS    Take by mouth. Take 2, 1000 mg capsules by mouth daily   OMEPRAZOLE (PRILOSEC) 40 MG CAPSULE    Take 1 capsule (40 mg total) by mouth daily.   OSELTAMIVIR (TAMIFLU) 75 MG CAPSULE    Take 1 capsule (75 mg total) by mouth 2 (two) times daily.   PSYLLIUM (METAMUCIL) 58.6 % POWDER    Take 1 packet by mouth daily.    TRACE MIN CACRCUFEKMGMNPSEZN (MINERALS PO)    Take by mouth. Alpha Mineral Support, take 1 teaspoon by mouth daily  Modified Medications   No medications on file  Discontinued Medications   OSELTAMIVIR (TAMIFLU) 75 MG CAPSULE    Take 1 capsule (75 mg total) by mouth 2 (two) times daily.   VITAMIN E/D-ALPHA NATURAL PO    Take by mouth. Alpha Vitamin Support, Take 1 teaspoon by mouth daily     Physical Exam:  Filed Vitals:   04/17/15 1426  BP: 120/70  Pulse: 85  Temp: 97.4 F (36.3 C)  TempSrc: Oral  Resp: 20  Height:  (1.6 m)  Weight: 146 lb 3.2 oz (66.316 kg)  SpO2: 98%   Body mass index is 25.9 kg/(m^2).  Physical Exam  Constitutional: She  appears well-developed.  Frail appearing in NAD, restless  HENT:  Head: Normocephalic and atraumatic.  Nose: Rhinorrhea present.  Mouth/Throat: Oropharynx is clear and moist.  Neck: Carotid bruit is not present.  Cardiovascular: Normal rate, regular rhythm, normal heart sounds and intact distal pulses.  Exam reveals no gallop and no friction rub.   No murmur heard. No LE edema b/l. no calf TTP.   Pulmonary/Chest: Effort normal and breath sounds normal. No respiratory distress. She has no wheezes. She has no rales.  Abdominal: There is no hepatomegaly.  Neurological: She is alert.  Skin: Skin is warm and dry.  Psychiatric: She has a normal mood and affect.   Labs reviewed: Basic Metabolic Panel:  Recent Labs  29/56/21 2052  12/26/14 0418  12/27/14 0317 02/19/15 1233  NA  --   < > 135 140 145*  K  --   < > 3.7 3.6 4.1  CL  --   < > 107 110 103  CO2  --   < > GLUCOSE  --   < > 104* 100* 86  BUN  --   < > CREATININE  --   < > 0.63 0.56 0.70  CALCIUM  --   < > 8.4* 8.5* 9.5  MG 2.0  --   --   --   --   TSH  --   --   --   --  1.710  < > = values in this interval not displayed. Liver Function Tests:  Recent Labs  12/25/14 0422 12/26/14 0418 02/19/15 1233  AST ALT 11* 12* 11  ALKPHOS 44 47 74  BILITOT 1.2 0.9 0.4  PROT 5.3* 5.1* 7.0  ALBUMIN 2.6* 2.6* 3.8   No results for input(s): LIPASE, AMYLASE in the last 8760 hours. No results for input(s): AMMONIA in the last 8760 hours. CBC:  Recent Labs  12/24/14 1515  12/25/14 0422 12/26/14 0418 12/27/14 0317 02/19/15 1233  WBC 6.3  < > 6.3 4.5 4.9 5.4  NEUTROABS 4.0  --   --   --   --  2.9  HGB 10.7*  < > 9.8* 9.7* 9.9*  --   HCT 32.6*  < > 29.2* 28.7* 29.3* 39.3  MCV 93.4  < > 90.1 89.1 88.3 91  PLT 157  < > 115* 107* 106* 163  < > = values in this interval not displayed. Lipid Panel: No results for input(s): CHOL, HDL, LDLCALC, TRIG, CHOLHDL, LDLDIRECT in the last 8760 hours. TSH:  Recent Labs  02/19/15 1233  TSH 1.710   A1C: No results found for: HGBA1C   Assessment/Plan 1. Influenza -cont tamiflu as prescribed -encourage PO intake -to use tylenol as needed for aches/pain/fever -to use delsym every 12 hours for cough -discussed return precautions with son    Janene Harvey. Biagio Borg  [facility] [phone] 8 am - 5 pm) [phone] (after hours)

## 2015-04-17 NOTE — Patient Instructions (Signed)
To use Childrens tylenol as needed for fever May use delsym every 12 hours as needed for cough Cont tamiflu  Notify if symptoms worsen, fever, cough or congestion worsen. Or pt not eating or drinking.

## 2015-04-18 ENCOUNTER — Ambulatory Visit: Payer: Medicare Other | Admitting: Internal Medicine

## 2015-06-18 ENCOUNTER — Other Ambulatory Visit: Payer: Medicare Other

## 2015-06-20 ENCOUNTER — Encounter: Payer: Self-pay | Admitting: Internal Medicine

## 2015-06-20 ENCOUNTER — Ambulatory Visit: Payer: Medicare Other | Admitting: Internal Medicine

## 2015-07-25 ENCOUNTER — Other Ambulatory Visit: Payer: Medicare Other

## 2015-07-25 DIAGNOSIS — G309 Alzheimer's disease, unspecified: Principal | ICD-10-CM

## 2015-07-25 DIAGNOSIS — E538 Deficiency of other specified B group vitamins: Secondary | ICD-10-CM

## 2015-07-25 DIAGNOSIS — F0280 Dementia in other diseases classified elsewhere without behavioral disturbance: Secondary | ICD-10-CM

## 2015-07-26 LAB — VITAMIN B12: VITAMIN B 12: 801 pg/mL (ref 211–946)

## 2015-07-26 LAB — BASIC METABOLIC PANEL WITH GFR
BUN/Creatinine Ratio: 23 (ref 12–28)
BUN: 17 mg/dL (ref 8–27)
CO2: 25 mmol/L (ref 18–29)
Calcium: 9.3 mg/dL (ref 8.7–10.3)
Chloride: 100 mmol/L (ref 96–106)
Creatinine, Ser: 0.75 mg/dL (ref 0.57–1.00)
GFR calc Af Amer: 82 mL/min/1.73 (ref >59)
GFR calc non Af Amer: 71 mL/min/1.73 (ref >59)
Glucose: 82 mg/dL (ref 65–99)
Potassium: 3.9 mmol/L (ref 3.5–5.2)
Sodium: 141 mmol/L (ref 134–144)

## 2015-07-26 LAB — ALT: ALT: 11 IU/L (ref 0–32)

## 2015-07-28 ENCOUNTER — Other Ambulatory Visit: Payer: Medicare Other

## 2015-07-30 ENCOUNTER — Ambulatory Visit: Payer: Medicare Other | Admitting: Internal Medicine

## 2015-08-08 ENCOUNTER — Ambulatory Visit: Payer: Medicare Other | Admitting: Internal Medicine

## 2015-09-12 ENCOUNTER — Ambulatory Visit: Payer: Medicare Other | Admitting: Internal Medicine

## 2015-09-12 DIAGNOSIS — Z0289 Encounter for other administrative examinations: Secondary | ICD-10-CM

## 2015-10-22 ENCOUNTER — Encounter: Payer: Self-pay | Admitting: Internal Medicine

## 2015-10-22 ENCOUNTER — Ambulatory Visit (INDEPENDENT_AMBULATORY_CARE_PROVIDER_SITE_OTHER): Payer: Medicare Other | Admitting: Internal Medicine

## 2015-10-22 VITALS — BP 122/68 | Temp 97.8°F | Ht 63.0 in | Wt 145.6 lb

## 2015-10-22 DIAGNOSIS — K2970 Gastritis, unspecified, without bleeding: Secondary | ICD-10-CM | POA: Diagnosis not present

## 2015-10-22 DIAGNOSIS — E538 Deficiency of other specified B group vitamins: Secondary | ICD-10-CM

## 2015-10-22 DIAGNOSIS — F0280 Dementia in other diseases classified elsewhere without behavioral disturbance: Secondary | ICD-10-CM

## 2015-10-22 DIAGNOSIS — G309 Alzheimer's disease, unspecified: Secondary | ICD-10-CM

## 2015-10-22 DIAGNOSIS — Z23 Encounter for immunization: Secondary | ICD-10-CM

## 2015-10-22 DIAGNOSIS — K259 Gastric ulcer, unspecified as acute or chronic, without hemorrhage or perforation: Secondary | ICD-10-CM | POA: Diagnosis not present

## 2015-10-22 MED ORDER — OMEPRAZOLE 40 MG PO CPDR: 40.0000 mg | DELAYED_RELEASE_CAPSULE | Freq: Every day | ORAL | 6 refills | Status: DC

## 2015-10-22 MED ORDER — MEMANTINE HCL-DONEPEZIL HCL ER 28-10 MG PO CP24: 1.0000 | ORAL_CAPSULE | Freq: Every day | ORAL | 6 refills | Status: DC

## 2015-10-22 NOTE — Patient Instructions (Addendum)
Resume namzaric for cognition and memory. Use starter pak 1st then get script filled at pharmacy  Resume omeprazole daily for gastritis. She will take indefinitely due to history of bleeding ulcer  Continue other medications as ordered  Flu shot given today  Follow up in 3 mos for routine visit

## 2015-10-22 NOTE — Progress Notes (Signed)
Patient ID: Norma Daugherty, female   DOB: 03-26-1925, 82 y.o.   MRN: [id number]    Location:  [facility] Place of Service: OFFICE  Chief Complaint  Patient presents with  . Medical Management of Chronic Issues    3 months routine visit, here with Husband  . Other    request flu vacc    HPI:  82 yo female seen today for f/u. Spouse reports increased "up and down" moments. She is no longer taking namzaric as it was not helping.   Alzheimer's dementia - spouse stopped namzaric as he did not feel it was helping. She has had dx of dementia x 10 yrs. Tried herbal supplements in past.  Appetite is excellent. Not sleeping well. She gets "locked into her room" at night.  Constipation - taking metamucil  She had a gastric bleeding ulcer in Nov 2016 that stopped on its own. She had EGD showing gastric ulcer that did not show signs of rebleed. tx with BID PPI x 8 weeks. She no longer takes daily PPI   Frequent syncopal episodes - improved since family stopped STEM cell supplements and other supplements. Last one on Jan 07, 2015  Gastric ulcer/NSAID induced gastritis - no longer on protonix daily. No further bleeding  Osteoporosis - takes supplements  Temporal arteritis hx - no sx's since 2010. tx with prednisone  Pt is a poor historian due to dementia. Hx obtained from spouse and caretaker. Lives with spouse and dog. Caretaker present Mon-Fri 8a-5p   Past Medical History:  Diagnosis Date  . Alzheimer disease   . Alzheimer's dementia   . Constipation   . External hemorrhoids 2015  . Low blood pressure   . Osteoporosis   . Stomach ulcer   . Temporal arteritis [facility])     Past Surgical History:  Procedure Laterality Date  . ESOPHAGOGASTRODUODENOSCOPY Left 12/26/2014   Procedure: ESOPHAGOGASTRODUODENOSCOPY (EGD);  Surgeon: Willis Modena, MD;  Location: [facility] ENDOSCOPY;  Service: Endoscopy;  Laterality: Left;    Patient Care Team: Kirt Boys, DO as PCP - General (Internal  Medicine) Cherlyn Roberts, MD as Consulting Physician (Dermatology) Hae Elliot Dally, MD as Referring Physician (Neurology)  Social History   Social History  . Marital status: Widowed    Spouse name: N/A  . Number of children: N/A  . Years of education: N/A   Occupational History  . Not on file.   Social History Main Topics  . Smoking status: Never Smoker  . Smokeless tobacco: Never Used  . Alcohol use No  . Drug use: No  . Sexual activity: Not on file   Other Topics Concern  . Not on file   Social History Narrative   Diet:       Do you drink/ eat things with caffeine?Yes      Marital status: Married                           What year were you married ? 2003      Do you live in a house, apartment,assistred living, condo, trailer, etc.)? House       Is it one or more stories? One      How many persons live in your home ? 2      Do you have any pets in your home ?(please list) 1 Dog      Current or past profession:Office Manager        Do you exercise?  No                         Type & how often:       Do you have a living will? Yes      Do you have a DNR form?     ?                  If not, do you want to discuss one? Yes       Do you have signed POA?HPOA forms? Yes                If so, please bring to your        appointment           reports that she has never smoked. She has never used smokeless tobacco. She reports that she does not drink alcohol or use drugs.  History reviewed. No pertinent family history. Family Status  Relation Status  . Father Deceased  . Mother Deceased  . Sister Deceased  . Brother Deceased  . Sister Deceased  . Daughter Alive  . Daughter Alive  . Daughter Deceased     Allergies  Allergen Reactions  . Morphine And Related Nausea And Vomiting    Medications: Patient's Medications  New Prescriptions   No medications on file  Previous Medications   BISACODYL (LAXATIVE PO)    Take by mouth. Swiss Herbal Laxative  take 2 capsules by mouth daily   CYANOCOBALAMIN ([employer] B-12) 1000 MCG/15ML LIQD    Give Vit. B12 gtts 1000mcg po daily   MEMANTINE HCL-DONEPEZIL HCL (NAMZARIC) 28-10 MG CP24    Take 1 capsule by mouth daily.   NUTRITIONAL SUPPLEMENTS (OSTEO ADVANCE PO)    Take by mouth. Take 2 table spoons by mouth daily   OMEGA 3-6-9 CAPS    Take by mouth. Take 2, 1000 mg capsules by mouth daily   OMEPRAZOLE (PRILOSEC) 40 MG CAPSULE    Take 1 capsule (40 mg total) by mouth daily.   PSYLLIUM (METAMUCIL) 58.6 % POWDER    Take 1 packet by mouth daily.    TRACE MIN CACRCUFEKMGMNPSEZN (MINERALS PO)    Take by mouth. Alpha Mineral Support, take 1 teaspoon by mouth daily  Modified Medications   No medications on file  Discontinued Medications   OSELTAMIVIR (TAMIFLU) 75 MG CAPSULE    Take 1 capsule (75 mg total) by mouth 2 (two) times daily.    Review of Systems  Unable to perform ROS: Dementia    Vitals:   10/22/15 1415  BP: 122/68  Temp: 97.8 F (36.6 C)  TempSrc: Oral  Weight: 145 lb 9.6 oz (66 kg)  Height: 5\' 3"  (1.6 m)   Body mass index is 25.79 kg/m.  Physical Exam  Constitutional: She appears well-developed.  Frail appearing in NAD  HENT:  Mouth/Throat: Oropharynx is clear and moist. No oropharyngeal exudate.  Eyes: Pupils are equal, round, and reactive to light. No scleral icterus.  Neck: Neck supple. Carotid bruit is not present. No tracheal deviation present. No thyromegaly present.  Cardiovascular: Normal rate, regular rhythm, normal heart sounds and intact distal pulses.  Exam reveals no gallop and no friction rub.   No murmur heard. No LE edema b/l. no calf TTP.   Pulmonary/Chest: Effort normal and breath sounds normal. No stridor. No respiratory distress. She has no wheezes. She has no rales.  Abdominal: Soft. Bowel sounds are normal. She exhibits no distension  and no mass. There is no hepatomegaly. There is no tenderness. There is no rebound and no guarding.  Musculoskeletal:  Gait  unsteady  Lymphadenopathy:    She has no cervical adenopathy.  Neurological: She is alert.  Skin: Skin is warm and dry. No rash noted.  Psychiatric: She has a normal mood and affect. She is agitated. Thought content is delusional.  She is picking up unseen objects from floor     Labs reviewed: Appointment on 07/25/2015  Component Date Value Ref Range Status  . Glucose 07/26/2015 82  65 - 99 mg/dL Final  . BUN 60/45/4098 17  8 - 27 mg/dL Final  . Creatinine, Ser 07/26/2015 0.75  0.57 - 1.00 mg/dL Final  . GFR calc non Af Amer 07/26/2015 71  >59 mL/min/1.73 Final  . GFR calc Af Amer 07/26/2015 82  >59 mL/min/1.73 Final  . BUN/Creatinine Ratio 07/26/2015 23  12 - 28 Final  . Sodium 07/26/2015 141  134 - 144 mmol/L Final  . Potassium 07/26/2015 3.9  3.5 - 5.2 mmol/L Final  . Chloride 07/26/2015 100  96 - 106 mmol/L Final  . CO2 07/26/2015 25  18 - 29 mmol/L Final  . Calcium 07/26/2015 9.3  8.7 - 10.3 mg/dL Final  . ALT 11/91/4782 11  0 - 32 IU/L Final  . Vitamin B-12 07/26/2015 801  211 - 946 pg/mL Final    No results found.   Assessment/Plan   ICD-9-CM ICD-10-CM   1. Alzheimer's dementia without behavioral disturbance, unspecified timing of dementia onset 331.0 G30.9 Memantine HCl-Donepezil HCl (NAMZARIC) 28-10 MG CP24   294.10 F02.80   2. Encounter for immunization Z23 Z23 Flu Vaccine QUAD 36+ mos IM  3. Gastritis 535.50 K29.70 omeprazole (PRILOSEC) 40 MG capsule  4. B12 deficiency 266.2 E53.8   5. Gastric ulcer, unspecified chronicity 531.90 K25.9 omeprazole (PRILOSEC) 40 MG capsule   Resume namzaric for cognition and memory. Use starter pak 1st (sample provided) then get script filled at pharmacy  Resume omeprazole daily for gastritis. She will take indefinitely due to history of bleeding ulcer  Continue other medications as ordered  Flu shot given today  Follow up in 3 mos for routine visit  Cailin Gebel S. Ancil Linsey  [facility] [address] [place], [place] [address] [phone] Cell (Monday-Friday 8 AM - 5 PM) [phone] After 5 PM and follow prompts

## 2015-12-21 ENCOUNTER — Emergency Department (HOSPITAL_COMMUNITY): Payer: Medicare Other

## 2015-12-21 ENCOUNTER — Encounter (HOSPITAL_COMMUNITY): Payer: Self-pay | Admitting: Emergency Medicine

## 2015-12-21 ENCOUNTER — Emergency Department (HOSPITAL_COMMUNITY)
Admission: EM | Admit: 2015-12-21 | Discharge: 2015-12-21 | Disposition: A | Discharge status: Home / Self Care | Payer: Medicare Other | Attending: Emergency Medicine | Admitting: Emergency Medicine

## 2015-12-21 DIAGNOSIS — F0281 Dementia in other diseases classified elsewhere with behavioral disturbance: Secondary | ICD-10-CM | POA: Insufficient documentation

## 2015-12-21 DIAGNOSIS — G309 Alzheimer's disease, unspecified: Secondary | ICD-10-CM | POA: Diagnosis not present

## 2015-12-21 DIAGNOSIS — F0280 Dementia in other diseases classified elsewhere without behavioral disturbance: Secondary | ICD-10-CM

## 2015-12-21 DIAGNOSIS — R55 Syncope and collapse: Secondary | ICD-10-CM | POA: Diagnosis present

## 2015-12-21 DIAGNOSIS — K5641 Fecal impaction: Secondary | ICD-10-CM | POA: Diagnosis not present

## 2015-12-21 LAB — CBC
HEMATOCRIT: 45 % (ref 36.0–46.0)
Hemoglobin: 15.4 g/dL — ABNORMAL HIGH (ref 12.0–15.0)
MCH: 31.2 pg (ref 26.0–34.0)
MCHC: 34.2 g/dL (ref 30.0–36.0)
MCV: 91.1 fL (ref 78.0–100.0)
Platelets: 167 10*3/uL (ref 150–400)
RBC: 4.94 MIL/uL (ref 3.87–5.11)
RDW: 12.3 % (ref 11.5–15.5)
WBC: 11.9 10*3/uL — AB (ref 4.0–10.5)

## 2015-12-21 LAB — BASIC METABOLIC PANEL
Anion gap: 11 (ref 5–15)
BUN: 21 mg/dL — AB (ref 6–20)
CHLORIDE: 102 mmol/L (ref 101–111)
CO2: 26 mmol/L (ref 22–32)
CREATININE: 0.93 mg/dL (ref 0.44–1.00)
Calcium: 10.1 mg/dL (ref 8.9–10.3)
GFR calc non Af Amer: 53 mL/min — ABNORMAL LOW (ref >60)
Glucose, Bld: 139 mg/dL — ABNORMAL HIGH (ref 65–99)
POTASSIUM: 4.6 mmol/L (ref 3.5–5.1)
SODIUM: 139 mmol/L (ref 135–145)

## 2015-12-21 LAB — URINALYSIS, ROUTINE W REFLEX MICROSCOPIC
Bilirubin Urine: NEGATIVE
GLUCOSE, UA: NEGATIVE mg/dL
Ketones, ur: NEGATIVE mg/dL
Nitrite: NEGATIVE
PH: 6.5 (ref 5.0–8.0)
Protein, ur: 30 mg/dL — AB
Specific Gravity, Urine: 1.023 (ref 1.005–1.030)

## 2015-12-21 LAB — DIFFERENTIAL
BASOS ABS: 0 10*3/uL (ref 0.0–0.1)
Basophils Relative: 0 %
Eosinophils Absolute: 0 10*3/uL (ref 0.0–0.7)
Eosinophils Relative: 0 %
LYMPHS ABS: 0.6 10*3/uL — AB (ref 0.7–4.0)
LYMPHS PCT: 5 %
MONO ABS: 0.5 10*3/uL (ref 0.1–1.0)
MONOS PCT: 4 %
NEUTROS ABS: 10.6 10*3/uL — AB (ref 1.7–7.7)
Neutrophils Relative %: 91 %

## 2015-12-21 LAB — URINE MICROSCOPIC-ADD ON: Squamous Epithelial / LPF: NONE SEEN

## 2015-12-21 LAB — TROPONIN I: Troponin I: 0.05 ng/mL (ref <0.03)

## 2015-12-21 MED ORDER — POLYETHYLENE GLYCOL 3350 17 G PO PACK: 17.0000 g | PACK | Freq: Every day | ORAL | 0 refills | Status: DC | PRN

## 2015-12-21 NOTE — ED Provider Notes (Signed)
[facility]-EMERGENCY DEPT Provider Note   CSN: [id number] Arrival date & time: 12/21/15  1636     History   Chief Complaint Chief Complaint  Patient presents with  . Loss of Consciousness    HPI Norma Daugherty is a 82 y.o. female.  She has a history of severe dementia. Today, she had a syncopal episode while trying to move her bowels. She had not had a bowel movement since October 26 and had been given a dose of 120 mL milk of magnesia. Syncope was brief. Patient is completely nonverbal, so no additional history is available. There was no vomiting.   The history is provided by a relative. The history is limited by the condition of the patient (Dementia, completely nonverbal).    Past Medical History:  Diagnosis Date  . Alzheimer disease   . Alzheimer's dementia   . Constipation   . External hemorrhoids 2015  . Low blood pressure   . Osteoporosis   . Stomach ulcer   . Temporal arteritis [facility])     Patient Active Problem List   Diagnosis Date Noted  . Upper GI bleeding 12/24/2014  . Alzheimer's dementia 12/24/2014  . Absolute anemia     Past Surgical History:  Procedure Laterality Date  . ESOPHAGOGASTRODUODENOSCOPY Left 12/26/2014   Procedure: ESOPHAGOGASTRODUODENOSCOPY (EGD);  Surgeon: Willis Modena, MD;  Location: [facility] ENDOSCOPY;  Service: Endoscopy;  Laterality: Left;    OB History    No data available       Home Medications    Prior to Admission medications   Medication Sig Start Date End Date Taking? Authorizing Provider  Bisacodyl (LAXATIVE PO) Take by mouth. Swiss Herbal Laxative take 2 capsules by mouth daily    Historical Provider, MD  Cyanocobalamin ([employer] B-12) 1000 MCG/15ML LIQD Give Vit. B12 gtts po daily 02/25/15   Kirt Boys, DO  Memantine HCl-Donepezil HCl (NAMZARIC) 28-10 MG CP24 Take 1 capsule by mouth daily. 10/22/15   Kirt Boys, DO  Nutritional Supplements (OSTEO ADVANCE PO) Take by mouth. Take 2 table spoons by mouth daily     Historical Provider, MD  Omega 3-6-9 CAPS Take by mouth. Take 2, 1000 mg capsules by mouth daily    Historical Provider, MD  omeprazole (PRILOSEC) 40 MG capsule Take 1 capsule (40 mg total) by mouth daily. 10/22/15   Monica Carter, DO  psyllium (METAMUCIL) 58.6 % powder Take 1 packet by mouth daily.     Historical Provider, MD  Trace Min CaCrCuFeKMgMnPSeZn (MINERALS PO) Take by mouth. Alpha Mineral Support, take 1 teaspoon by mouth daily    Historical Provider, MD    Family History History reviewed. No pertinent family history.  Social History Social History  Substance Use Topics  . Smoking status: Never Smoker  . Smokeless tobacco: Never Used  . Alcohol use No     Allergies   Morphine and related   Review of Systems Review of Systems  Unable to perform ROS: Dementia     Physical Exam Updated Vital Signs BP 147/68 (BP Location: Right Arm)   Pulse 95   Temp 97.9 F (36.6 C) (Oral)   Resp 19   Ht  (1.6 m)   Wt 140 lb (63.5 kg)   SpO2 95%   BMI 24.80 kg/m   Physical Exam  Nursing note and vitals reviewed.  82 year old female, resting comfortably and in no acute distress. Vital signs are significant for mild hypertension. Oxygen saturation is 95%, which is normal. Head is  normocephalic and atraumatic. PERRLA, EOMI. Oropharynx is clear. Neck is nontender and supple without adenopathy or JVD. Back is nontender and there is no CVA tenderness. Lungs are clear without rales, wheezes, or rhonchi. Chest is nontender. Heart has regular rate and rhythm without murmur. Abdomen is soft, flat, nontender without masses or hepatosplenomegaly and peristalsis is normoactive. Rectal: Large, inflamed external hemorrhoids. Moderately hard fecal impaction present. Stool is brown. Extremities have no cyanosis or edema, full range of motion is present. Skin is warm and dry without rash. Neurologic: She is awake but nonverbal and does not respond to verbal commands, cranial nerves are  intact, there are no gross motor or sensory deficits. Generalized increased muscle tone with moderate cogwheel rigidity present.  ED Treatments / Results  Labs (all labs ordered are listed, but only abnormal results are displayed) Labs Reviewed  BASIC METABOLIC PANEL - Abnormal; Notable for the following:       Result Value   Glucose, Bld 139 (*)    BUN 21 (*)    GFR calc non Af Amer 53 (*)    All other components within normal limits  CBC - Abnormal; Notable for the following:    WBC 11.9 (*)    Hemoglobin 15.4 (*)    All other components within normal limits  URINALYSIS, ROUTINE W REFLEX MICROSCOPIC (NOT AT [facility]) - Abnormal; Notable for the following:    Color, Urine AMBER (*)    Hgb urine dipstick LARGE (*)    Protein, ur 30 (*)    Leukocytes, UA TRACE (*)    All other components within normal limits  TROPONIN I - Abnormal; Notable for the following:    Troponin I 0.05 (*)    All other components within normal limits  DIFFERENTIAL - Abnormal; Notable for the following:    Neutro Abs 10.6 (*)    Lymphs Abs 0.6 (*)    All other components within normal limits  URINE MICROSCOPIC-ADD ON - Abnormal; Notable for the following:    Bacteria, UA RARE (*)    All other components within normal limits  TROPONIN I    EKG  EKG Interpretation  Date/Time:  Sunday December 21 2015 16:48:33 EST Ventricular Rate:  95 PR Interval:    QRS Duration: 90 QT Interval:  374 QTC Calculation: 471 R Axis:   -50 Text Interpretation:  Sinus rhythm LAD, consider left anterior fascicular block Borderline T abnormalities, lateral leads When compared with ECG of 12/24/2014, No significant change was found Confirmed by [facility]  MD, Tameca Jerez ([id number]) on 12/21/2015 4:52:44 PM       Procedures Procedures (including critical care time) Manual fecal disimpaction Patient tolerated procedure well without any immediate complications.  Medications Ordered in ED Medications - No data to display   Initial  Impression / Assessment and Plan / ED Course  I have reviewed the triage vital signs and the nursing notes.  Pertinent lab results that were available during my care of the patient were reviewed by me and considered in my medical decision making (see chart for details).  Clinical Course    Syncope while straining at bowel movement, most likely vagal. Fecal impaction present which was manually disimpacted. Old records are reviewed, and she has 2 prior ED visits for syncope. She was made DO NOT RESUSCITATE at a hospitalization last year. DO NOT RESUSCITATE status was confirmed with her family. ECG shows no acute changes. Will screen for electronic disturbances, but given advanced dementia and DO NOT RESUSCITATE status, will  not proceed with any further workup.  Initial troponin has come back minimally elevated. She was kept in the ED for repeat troponin which has come back normal. She is discharged with prescription for polyethylene glycol to use as needed for constipation.  Final Clinical Impressions(s) / ED Diagnoses   Final diagnoses:  Vasovagal syncope  Fecal impaction in rectum (HCC)  Alzheimer's dementia without behavioral disturbance, unspecified timing of dementia onset    New Prescriptions New Prescriptions   POLYETHYLENE GLYCOL (MIRALAX / GLYCOLAX) PACKET    Take 17 g by mouth daily as needed (constipation).     Dione Booze, MD 12/21/15 2222

## 2015-12-21 NOTE — ED Notes (Signed)
Dr.Glick did manual disimpaction of a large amount of stool.

## 2015-12-21 NOTE — ED Notes (Signed)
Notified Dr. Preston FleetingGlick of Trop 0.05 at 1838.

## 2015-12-21 NOTE — ED Triage Notes (Signed)
From home via EMS; family called because syncopal on toilet trying to have a BM. Constipation x several days. Did not fall off toilet. No falls. EMS arrived to find her sitting on toilet pale and clammy. They laid her down and color improved. VSS 88, 146/71, 95%, CBG 185. History of Alzhiemers dementia; she is staring and not talking in route; family told EMS she is at her baseline.

## 2015-12-22 ENCOUNTER — Telehealth: Payer: Self-pay | Admitting: *Deleted

## 2015-12-22 NOTE — Telephone Encounter (Signed)
Bruce called and stated that patient went to ER last night due to no BM since 10/26, he found her cramped and passed out and called 911. BP was way low at the hospital. Dr. Camie PatienceGot part of the stool out with his finger. They still think she has a lot more in there and stated that she is not in good shape and needs to be seen. No appointments with Dr. Montez Moritaarter this week, I scheduled with Shanda BumpsJessica for tomorrow at 1pm

## 2015-12-23 ENCOUNTER — Ambulatory Visit (INDEPENDENT_AMBULATORY_CARE_PROVIDER_SITE_OTHER): Payer: Medicare Other | Admitting: Nurse Practitioner

## 2015-12-23 ENCOUNTER — Encounter: Payer: Self-pay | Admitting: Nurse Practitioner

## 2015-12-23 VITALS — BP 98/62 | HR 68 | Temp 97.4°F | Resp 18 | Ht 63.0 in | Wt 139.0 lb

## 2015-12-23 DIAGNOSIS — G309 Alzheimer's disease, unspecified: Secondary | ICD-10-CM

## 2015-12-23 DIAGNOSIS — K5901 Slow transit constipation: Secondary | ICD-10-CM | POA: Diagnosis not present

## 2015-12-23 DIAGNOSIS — F0280 Dementia in other diseases classified elsewhere without behavioral disturbance: Secondary | ICD-10-CM

## 2015-12-23 NOTE — Progress Notes (Signed)
Careteam: Patient Care Team: Kirt BoysMonica Carter, DO as PCP - General (Internal Medicine) Cherlyn RobertsFrederick Lupton, MD as Consulting Physician (Dermatology) Hae Elliot DallyWon Shin, MD as Referring Physician (Neurology)  Advanced Directive information Does patient have an advance directive?: Yes, Type of Advance Directive: Healthcare Power of Attorney  Allergies  Allergen Reactions  . Morphine And Related Nausea And Vomiting    Chief Complaint  Patient presents with  . Acute Visit    Issues with bowls. Pt son did not get miralax when pt was left ED but pt did have large BM yesterday.   . Other    Pt son states that insurance will not pay for namzaric-need alternative     HPI: Patient is a 82 y.o. female seen in the office today to follow up constipation. Pt had not had BM in 10 days. She was manual disimpacted in the ED after syncopal episode when trying to have a BM.  Was given MOM Sunday and Monday and finally had large BM, was recommended to take mirlax daily but she has not started had yet.  Also taking metamucil and herbal supplement but that has been stopped.  Previously having a BM every 3-5 days that are "tennis balls" sized.   Insurance not paying for namzaric at all. Has not been on medication for several month. Pt is end stage dementia, husband does not really know what we are "saving" she is minimally verbal. She can feed her self but requires a lot of assistance and care with ADLS.    Review of Systems:  Review of Systems  Unable to perform ROS: Dementia    Past Medical History:  Diagnosis Date  . Alzheimer disease   . Alzheimer's dementia   . Constipation   . External hemorrhoids 2015  . Low blood pressure   . Osteoporosis   . Stomach ulcer   . Temporal arteritis [facility](HCC)    Past Surgical History:  Procedure Laterality Date  . ESOPHAGOGASTRODUODENOSCOPY Left 12/26/2014   Procedure: ESOPHAGOGASTRODUODENOSCOPY (EGD);  Surgeon: Willis ModenaWilliam Outlaw, MD;  [facility];   Service: Endoscopy;  Laterality: Left;   Social History:   reports that she has never smoked. She has never used smokeless tobacco. She reports that she does not drink alcohol or use drugs.  History reviewed. No pertinent family history.  Medications: Patient's Medications  New Prescriptions   No medications on file  Previous Medications   NUTRITIONAL SUPPLEMENTS (OSTEO ADVANCE PO)    Take 30 mLs by mouth daily.    OMEGA 3-6-9 CAPS    Take 2 capsules by mouth daily.    OMEPRAZOLE (PRILOSEC) 40 MG CAPSULE    Take 40 mg by mouth daily.   OVER THE COUNTER MEDICATION    Take 5 mLs by mouth daily. Alpha Mineral Support   POLYETHYLENE GLYCOL (MIRALAX / GLYCOLAX) PACKET    Take 17 g by mouth daily as needed (constipation).   PSYLLIUM (METAMUCIL) 58.6 % POWDER    Take 1 packet by mouth daily.   Modified Medications   No medications on file  Discontinued Medications   OVER THE COUNTER MEDICATION    Take 2 capsules by mouth daily. Swiss herbal Laxative     Physical Exam:  Vitals:   12/23/15 1313  BP: 98/62  Pulse: 68  Resp: 18  Temp: 97.4 F (36.3 C)  TempSrc: Oral  Weight: 139 lb (63 kg)  Height: 5\' 3"  (1.6 m)   Body mass index is 24.62 kg/m.  Physical Exam  Constitutional: She appears well-developed.  Frail appearing in NAD  Eyes: Pupils are equal, round, and reactive to light.  Neck: Carotid bruit is not present.  Cardiovascular: Normal rate, regular rhythm and normal heart sounds.   No LE edema b/l. no calf TTP.   Pulmonary/Chest: Effort normal and breath sounds normal.  Abdominal: Soft. Bowel sounds are normal. She exhibits no distension and no mass. There is no hepatomegaly. There is no tenderness. There is no rebound and no guarding.  Musculoskeletal:  Gait unsteady  Neurological: She is alert.  Skin: Skin is warm and dry.  Psychiatric: She has a normal mood and affect. She is agitated. Thought content is delusional.  She is picking at unseen object    Labs  reviewed: Basic Metabolic Panel:  Recent Labs  16/11/9609/08/16 2052  02/19/15 1233 07/25/15 0959 12/21/15 1712  NA  --   < > 145* 141 139  K  --   < > 4.1 3.9 4.6  CL  --   < > 103 100 102  CO2  --   < > 25 25 26   GLUCOSE  --   < > 86 82 139*  BUN  --   < > 18 17 21*  CREATININE  --   < > 0.70 0.75 0.93  CALCIUM  --   < > 9.5 9.3 10.1  MG 2.0  --   --   --   --   TSH  --   --  1.710  --   --   < > = values in this interval not displayed. Liver Function Tests:  Recent Labs  12/25/14 0422 12/26/14 0418 02/19/15 1233 07/25/15 0959  AST 15 16 16   --   ALT 11* 12* 11 11  ALKPHOS 44 47 74  --   BILITOT 1.2 0.9 0.4  --   PROT 5.3* 5.1* 7.0  --   ALBUMIN 2.6* 2.6* 3.8  --    No results for input(s): LIPASE, AMYLASE in the last 8760 hours. No results for input(s): AMMONIA in the last 8760 hours. CBC:  Recent Labs  12/24/14 1515  12/26/14 0418 12/27/14 0317 02/19/15 1233 12/21/15 1712  WBC 6.3  < > 4.5 4.9 5.4 11.9*  NEUTROABS 4.0  --   --   --  2.9 10.6*  HGB 10.7*  < > 9.7* 9.9*  --  15.4*  HCT 32.6*  < > 28.7* 29.3* 39.3 45.0  MCV 93.4  < > 89.1 88.3 91 91.1  PLT 157  < > 107* 106* 163 167  < > = values in this interval not displayed. Lipid Panel: No results for input(s): CHOL, HDL, LDLCALC, TRIG, CHOLHDL, LDLDIRECT in the last 8760 hours. TSH:  Recent Labs  02/19/15 1233  TSH 1.710   A1C: No results found for: HGBA1C   Assessment/Plan 1. Alzheimer's dementia without behavioral disturbance, unspecified timing of dementia onset Discussed benefit of dementia related medications and due to the severity of disease with effort it takes to give pt medication family has opted not to start medication.   2. Slow transit constipation Has had BM yesterday, to start miralax daily for bowel regimen to prevent severe constipation. Cont to encourage proper hydration.   Janene HarveyJessica K. Biagio BorgEubanks, AGNP  [facility] [phone](Monday-Friday 8 am - 5  pm) [phone] (after hours)

## 2015-12-23 NOTE — Patient Instructions (Signed)
miralax 17 gm with 8 oz of water daily-- if having loose stools decrease to every other day.

## 2015-12-24 ENCOUNTER — Encounter: Payer: Medicare Other | Admitting: Internal Medicine

## 2016-01-04 IMAGING — CT CT HEAD W/O CM
2 series · 17 of 30 positions shown, 20 images · non-contrast
Comparison: None.

CLINICAL DATA: Syncopal episode and unresponsive for about 5 min,
no trauma

EXAM:
CT HEAD WITHOUT CONTRAST
TECHNIQUE: Contiguous axial images were obtained from the base of the skull
through the vertex without intravenous contrast.

[Series 2: head w/o · axial · non-contrast · 0.45mm/px · z∈[-246,-136]mm · 9 of 28 slices shown, 12 images]
[im 3/28  brain]
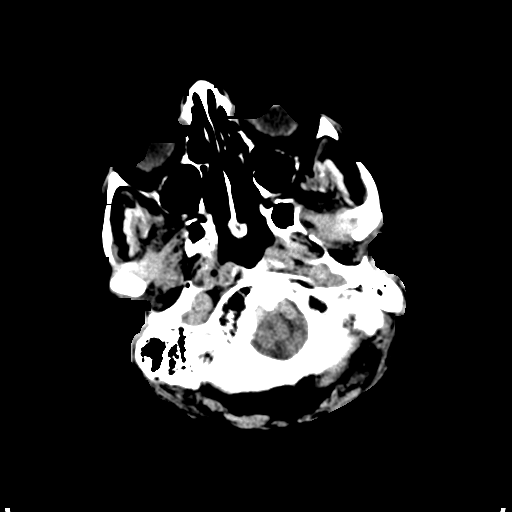
[im 3/28  bone]
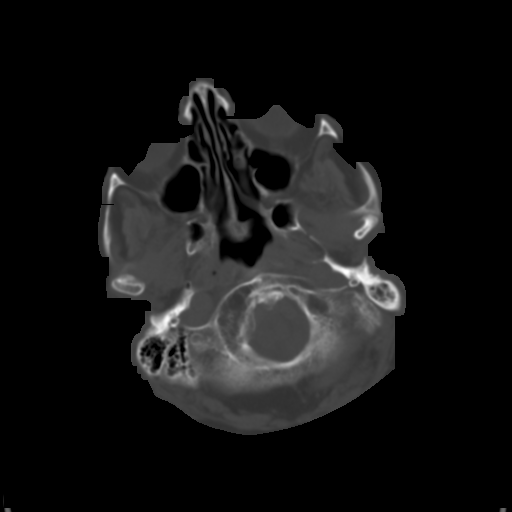
[im 6/28  brain]
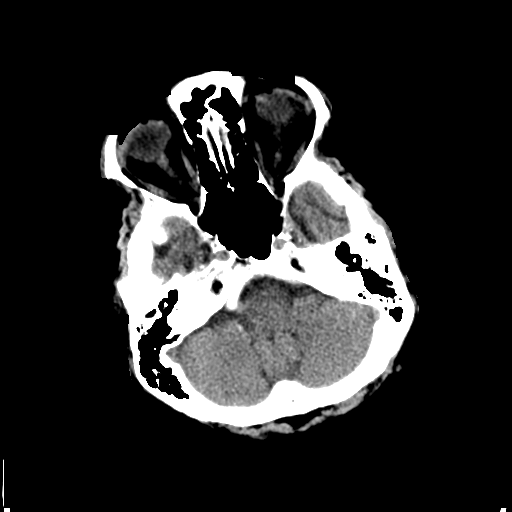
[im 9/28  brain]
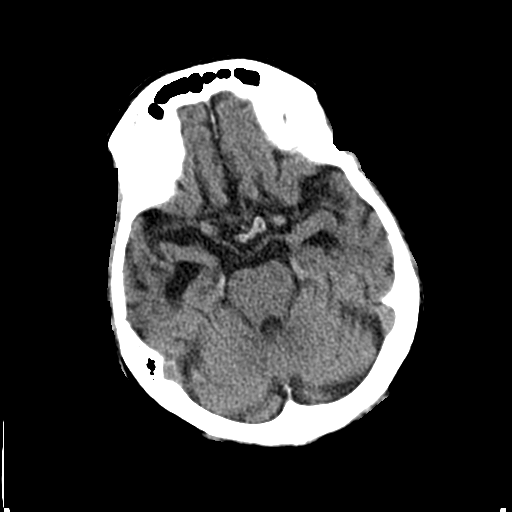
[im 11/28  brain]
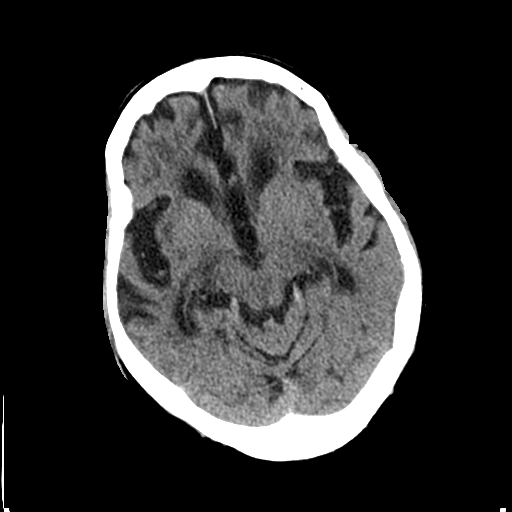
[im 14/28  brain]
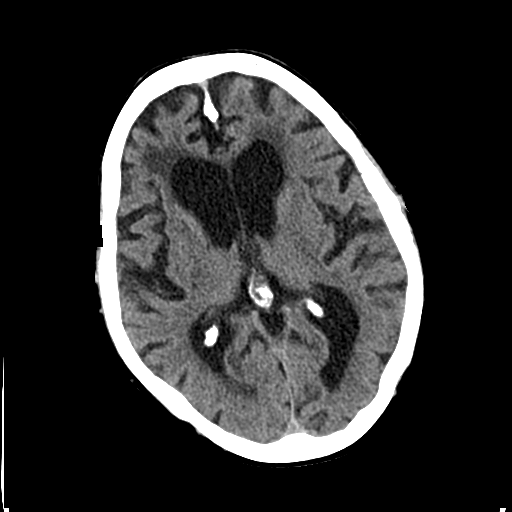
[im 14/28  bone]
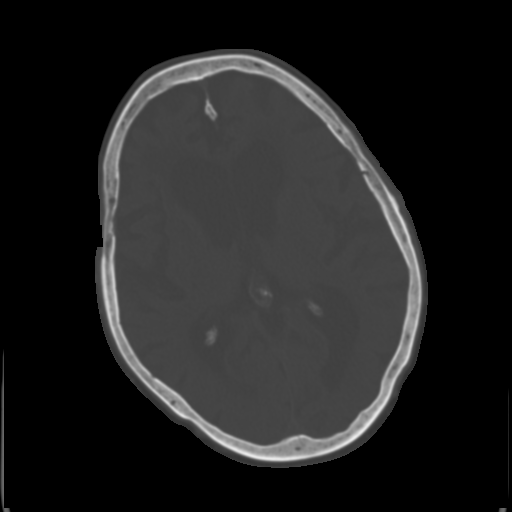
[im 17/28  brain]
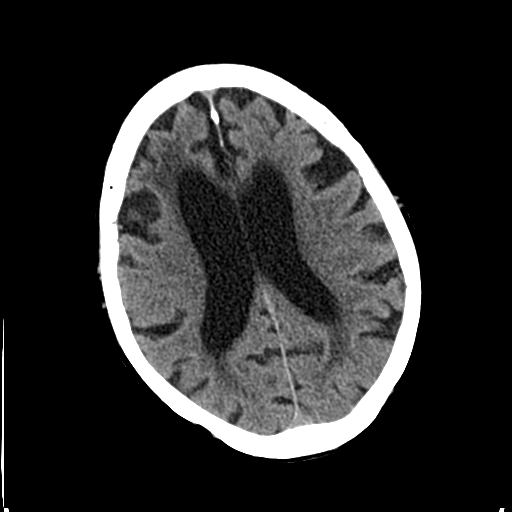
[im 19/28  brain]
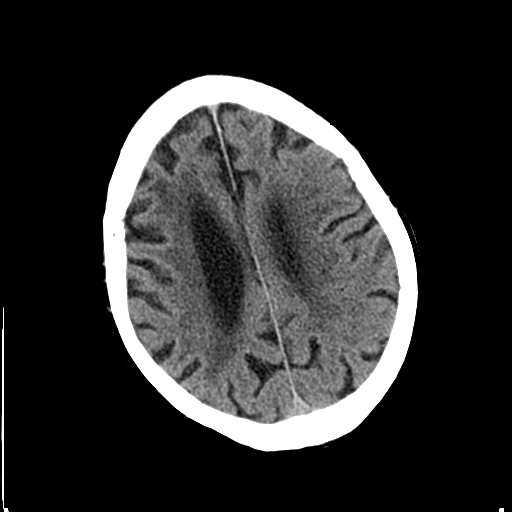
[im 22/28  brain]
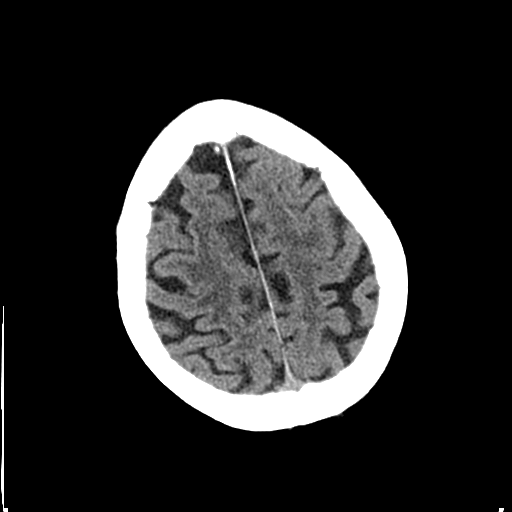
[im 25/28  brain]
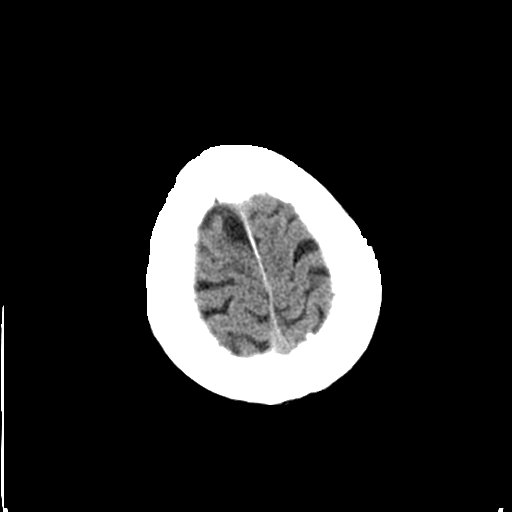
[im 25/28  bone]
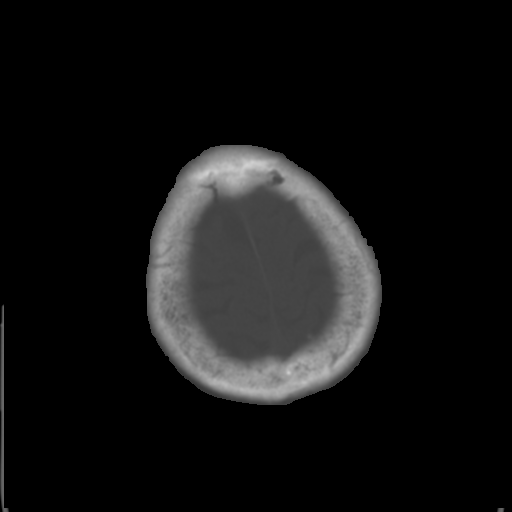

[Series 3: bone windows · axial · 0.45mm/px · z∈[-242,-136]mm · 8 of 46 slices shown]
[im 6/46  bone]
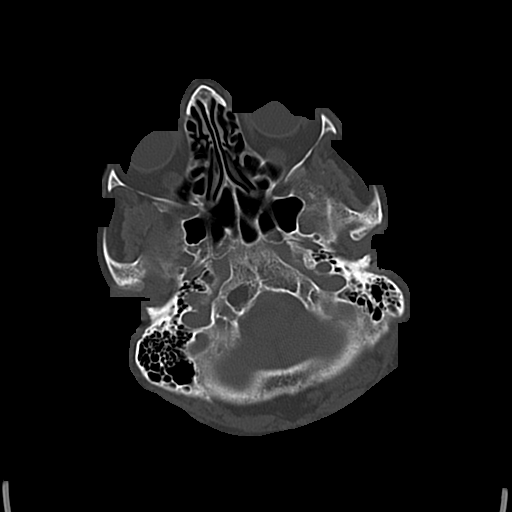
[im 11/46  bone]
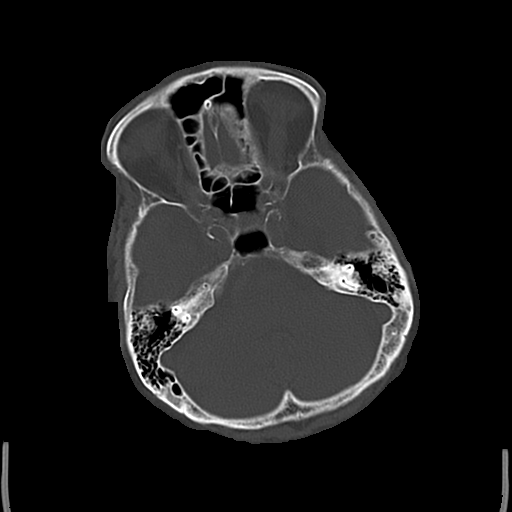
[im 16/46  bone]
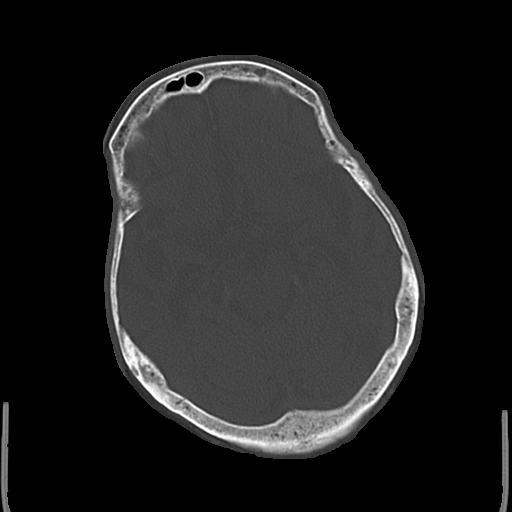
[im 21/46  bone]
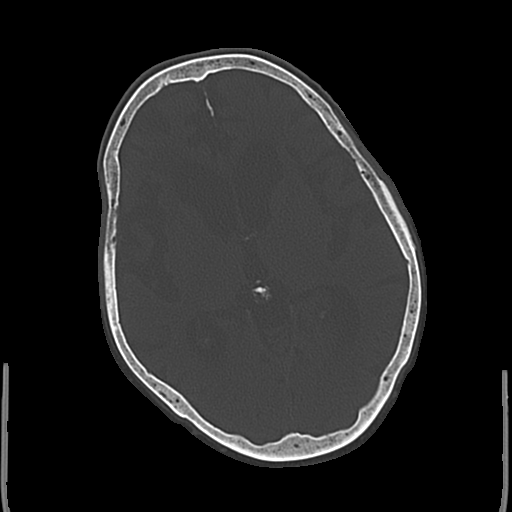
[im 26/46  bone]
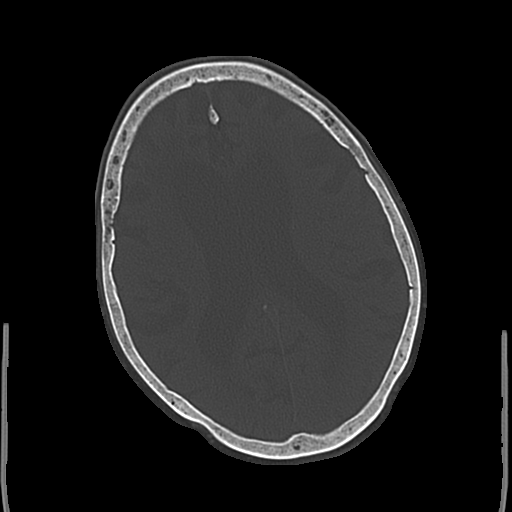
[im 31/46  bone]
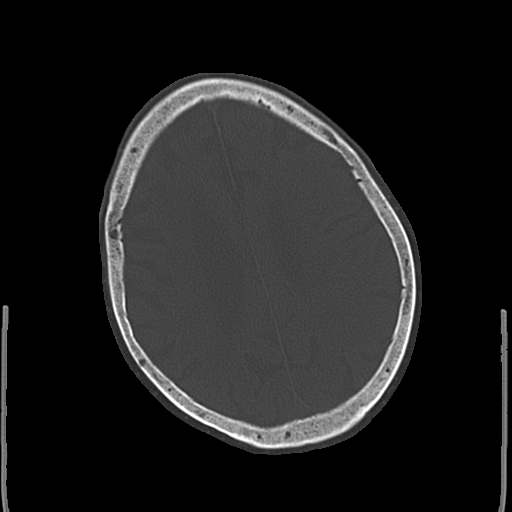
[im 36/46  bone]
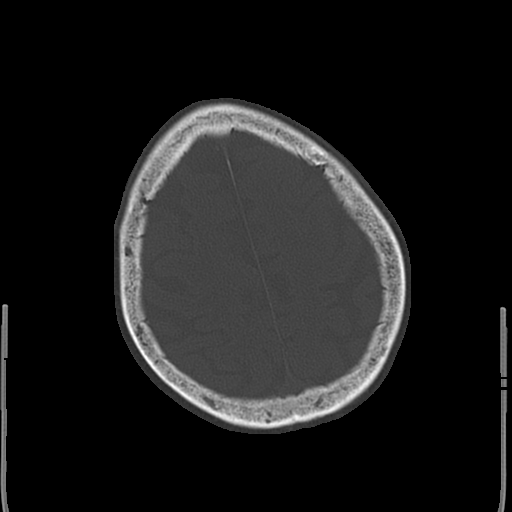
[im 41/46  bone]
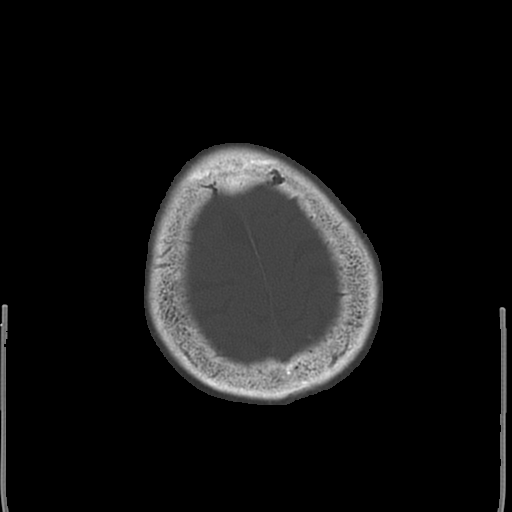

[17 of 30 positions shown; findings below may reference images not displayed]

FINDINGS: No skull fracture. No hemorrhage or extra-axial fluid. Advanced
diffuse cortical atrophy and low attenuation in the deep white
matter consistent with chronic white matter involutional change.
Proportional dilatation of the ventricles.
IMPRESSION: No acute findings.  Significant age-related degenerative change.

## 2016-02-03 ENCOUNTER — Other Ambulatory Visit: Payer: Self-pay | Admitting: Internal Medicine

## 2016-04-19 ENCOUNTER — Other Ambulatory Visit: Payer: Self-pay | Admitting: Internal Medicine

## 2016-08-10 ENCOUNTER — Ambulatory Visit (INDEPENDENT_AMBULATORY_CARE_PROVIDER_SITE_OTHER): Payer: Medicare Other | Admitting: Internal Medicine

## 2016-08-10 ENCOUNTER — Encounter: Payer: Self-pay | Admitting: Internal Medicine

## 2016-08-10 VITALS — BP 124/60 | HR 76 | Ht 63.0 in | Wt 134.0 lb

## 2016-08-10 DIAGNOSIS — R634 Abnormal weight loss: Secondary | ICD-10-CM

## 2016-08-10 DIAGNOSIS — W57XXXA Bitten or stung by nonvenomous insect and other nonvenomous arthropods, initial encounter: Secondary | ICD-10-CM

## 2016-08-10 DIAGNOSIS — R32 Unspecified urinary incontinence: Secondary | ICD-10-CM | POA: Diagnosis not present

## 2016-08-10 DIAGNOSIS — R55 Syncope and collapse: Secondary | ICD-10-CM | POA: Diagnosis not present

## 2016-08-10 DIAGNOSIS — G309 Alzheimer's disease, unspecified: Secondary | ICD-10-CM

## 2016-08-10 DIAGNOSIS — E538 Deficiency of other specified B group vitamins: Secondary | ICD-10-CM | POA: Diagnosis not present

## 2016-08-10 DIAGNOSIS — F0280 Dementia in other diseases classified elsewhere without behavioral disturbance: Secondary | ICD-10-CM

## 2016-08-10 DIAGNOSIS — K5901 Slow transit constipation: Secondary | ICD-10-CM | POA: Diagnosis not present

## 2016-08-10 LAB — CBC WITH DIFFERENTIAL/PLATELET
BASOS PCT: 0 %
Basophils Absolute: 0 cells/uL (ref 0–200)
EOS PCT: 2 %
Eosinophils Absolute: 124 cells/uL (ref 15–500)
HEMATOCRIT: 39.8 % (ref 35.0–45.0)
Hemoglobin: 13.3 g/dL (ref 11.7–15.5)
LYMPHS PCT: 33 %
Lymphs Abs: 2046 cells/uL (ref 850–3900)
MCH: 30.9 pg (ref 27.0–33.0)
MCHC: 33.4 g/dL (ref 32.0–36.0)
MCV: 92.6 fL (ref 80.0–100.0)
MPV: 10.5 fL (ref 7.5–12.5)
Monocytes Absolute: 434 cells/uL (ref 200–950)
Monocytes Relative: 7 %
NEUTROS PCT: 58 %
Neutro Abs: 3596 cells/uL (ref 1500–7800)
PLATELETS: 194 10*3/uL (ref 140–400)
RBC: 4.3 MIL/uL (ref 3.80–5.10)
RDW: 13.3 % (ref 11.0–15.0)
WBC: 6.2 10*3/uL (ref 3.8–10.8)

## 2016-08-10 LAB — URINALYSIS, MICROSCOPIC ONLY
CRYSTALS: NONE SEEN [HPF]
Casts: NONE SEEN [LPF]
YEAST: NONE SEEN [HPF]

## 2016-08-10 LAB — URINALYSIS, ROUTINE W REFLEX MICROSCOPIC
BILIRUBIN URINE: NEGATIVE
Glucose, UA: NEGATIVE
KETONES UR: NEGATIVE
NITRITE: NEGATIVE
SPECIFIC GRAVITY, URINE: 1.018 (ref 1.001–1.035)
pH: 5.5 (ref 5.0–8.0)

## 2016-08-10 MED ORDER — DOXYCYCLINE HYCLATE 50 MG PO CAPS
100.0000 mg | ORAL_CAPSULE | Freq: Two times a day (BID) | ORAL | 0 refills | Status: DC
Start: 2016-08-10 — End: 2017-02-02

## 2016-08-10 NOTE — Patient Instructions (Signed)
Take doxycycline 2 caps 2 times daily x 10 days  Continue other medications as ordered  will call with lab results  Will call with palliative care referral  Tick removed without complication from left arm  Follow up in 6 mos for dementia and weight loss

## 2016-08-10 NOTE — Progress Notes (Signed)
Patient ID: Norma Daugherty, female   DOB: 05-18-1925, 83 y.o.   MRN: [id number]    Location:  [facility] Place of Service: OFFICE  Chief Complaint  Patient presents with  . Medical Management of Chronic Issues    would like referral to Hospice for pallive care. Need a hospital bed, she fell out of bed yesterday,  something to pick her up with. Because of her Alizhermer's. Here with husband and care giver Butch Penny  . urine    cloudy  . Tick Removal    under left arm    HPI:  83 yo female seen today for f/u. Family reports worsening balance and she has been falling out of bed. She feeds herself most of the time (95%) and has good appetite. She sleeps okay. No wandering. She is a poor historian due to dementia. Hx obtained from chart. Familly c/a UTI as urine appears cloudy and she has become more incontinent. Family interested in palliative care referral. Needs hospital bed. Family noticed tick on left arm this AM. The have dogs which go outside. She drinks >8 oz water per day.  Alzheimer's dementia - spouse stopped namzaric as he did not feel it was helping. She has had dx of dementia x 10 yrs. Tried herbal supplements in past.  Appetite is excellent. Sleeps well. She gets "locked into her room" at night. Weight down 5 lbs since last OV in Nov 2017 but she has lost 15 lbs total since Feb 2017.  Constipation - stable on metamucil and miralax  Gastric ulcer/NSAID induced gastritis - She had a gastric bleeding ulcer in Nov 2016 that stopped on its own. She had EGD showing gastric ulcer that did not show signs of rebleed. tx with BID PPI x 8 weeks. She no longer takes daily PPI.   Frequent syncopal episodes - markedly improved since family stopped STEM cell supplements and other supplements. Last one on early spring 2018  Osteoporosis - takes supplements  Temporal arteritis hx - no sx's since 2010. tx with prednisone  B12 deficiency - stable on B12 supplements. b12 level 801  Past Medical  History:  Diagnosis Date  . Alzheimer disease   . Alzheimer's dementia   . Constipation   . External hemorrhoids 2015  . Low blood pressure   . Osteoporosis   . Stomach ulcer   . Temporal arteritis [facility])     Past Surgical History:  Procedure Laterality Date  . ESOPHAGOGASTRODUODENOSCOPY Left 12/26/2014   Procedure: ESOPHAGOGASTRODUODENOSCOPY (EGD);  Surgeon: Arta Silence, MD;  Location: [facility] ENDOSCOPY;  Service: Endoscopy;  Laterality: Left;    Patient Care Team: Gildardo Cranker, DO as PCP - General (Internal Medicine) Druscilla Brownie, MD as Consulting Physician (Dermatology) Orvil Feil, MD as Referring Physician (Neurology)  Social History   Social History  . Marital status: Widowed    Spouse name: N/A  . Number of children: N/A  . Years of education: N/A   Occupational History  . Not on file.   Social History Main Topics  . Smoking status: Never Smoker  . Smokeless tobacco: Never Used  . Alcohol use No  . Drug use: No  . Sexual activity: Not on file   Other Topics Concern  . Not on file   Social History Narrative   Diet:       Do you drink/ eat things with caffeine?Yes      Marital status: Married  What year were you married ? 2003      Do you live in a house, apartment,assistred living, condo, trailer, etc.)? House       Is it one or more stories? One      How many persons live in your home ? 2      Do you have any pets in your home ?(please list) 1 Dog      Current or past profession:Office Manager        Do you exercise?    No                         Type & how often:       Do you have a living will? Yes      Do you have a DNR form?     ?                  If not, do you want to discuss one? Yes       Do you have signed POA?HPOA forms? Yes                If so, please bring to your        appointment           reports that she has never smoked. She has never used smokeless tobacco. She reports that she does not  drink alcohol or use drugs.  History reviewed. No pertinent family history. Family Status  Relation Status  . Father Deceased  . Mother Deceased  . Sister Deceased  . Brother Deceased  . Sister Deceased  . Daughter Alive  . Daughter Alive  . Daughter Deceased     Allergies  Allergen Reactions  . Morphine And Related Nausea And Vomiting    Medications: Patient's Medications  New Prescriptions   No medications on file  Previous Medications   NUTRITIONAL SUPPLEMENTS (OSTEO ADVANCE PO)    Take 30 mLs by mouth daily.    OMEGA 3-6-9 CAPS    Take 2 capsules by mouth daily.    OMEPRAZOLE (PRILOSEC) 40 MG CAPSULE    Take 40 mg by mouth daily.   OVER THE COUNTER MEDICATION    Take 5 mLs by mouth daily. Alpha Mineral Support   POLYETHYLENE GLYCOL (MIRALAX / GLYCOLAX) PACKET    dissolve 1 packet (DISSOLVED IN WATER )AND DRINK DAILY AS NEEDED FOR CONSTIPATION   PSYLLIUM (METAMUCIL) 58.6 % POWDER    Take 1 packet by mouth daily.   Modified Medications   No medications on file  Discontinued Medications   No medications on file    Review of Systems  Unable to perform ROS: Dementia    Vitals:   08/10/16 0926  BP: 124/60  Pulse: 76  SpO2: 96%  Weight: 134 lb (60.8 kg)  Height: 5' 3"  (1.6 m)   Body mass index is 23.74 kg/m.  Physical Exam  Constitutional: She appears well-developed.  Frail appearing in NAD  HENT:  Mouth/Throat: Oropharynx is clear and moist. No oropharyngeal exudate.  Eyes: Pupils are equal, round, and reactive to light. No scleral icterus.  Neck: Neck supple. Carotid bruit is not present. No tracheal deviation present. No thyromegaly present.  Cardiovascular: Normal rate, regular rhythm, normal heart sounds and intact distal pulses.  Exam reveals no gallop and no friction rub.   No murmur heard. No LE edema b/l. no calf TTP.   Pulmonary/Chest: Effort normal and breath sounds normal.  No stridor. No respiratory distress. She has no wheezes. She has no  rales.  Abdominal: Soft. Bowel sounds are normal. She exhibits no distension and no mass. There is no hepatomegaly. There is no tenderness. There is no rebound and no guarding.  Musculoskeletal: She exhibits edema.  Gait unsteady  Lymphadenopathy:    She has no cervical adenopathy.  Neurological: She is alert.  Skin: Skin is warm and dry. Rash noted.  Left posterior arm with tiny tick present and lodged into skin. Peripheral redness. No d/c.   Psychiatric: She has a normal mood and affect. She is agitated. Thought content is delusional.  She is rocking back and forth     Labs reviewed: No visits with results within 3 Month(s) from this visit.  Latest known visit with results is:  Admission on 12/21/2015, Discharged on 12/21/2015  Component Date Value Ref Range Status  . Sodium 12/21/2015 139  135 - 145 mmol/L Final  . Potassium 12/21/2015 4.6  3.5 - 5.1 mmol/L Final  . Chloride 12/21/2015 102  101 - 111 mmol/L Final  . CO2 12/21/2015 26  22 - 32 mmol/L Final  . Glucose, Bld 12/21/2015 139* 65 - 99 mg/dL Final  . BUN 12/21/2015 21* 6 - 20 mg/dL Final  . Creatinine, Ser 12/21/2015 0.93  0.44 - 1.00 mg/dL Final  . Calcium 12/21/2015 10.1  8.9 - 10.3 mg/dL Final  . GFR calc non Af Amer 12/21/2015 53* >60 mL/min Final  . GFR calc Af Amer 12/21/2015 >60  >60 mL/min Final   Comment: (NOTE) The eGFR has been calculated using the CKD EPI equation. This calculation has not been validated in all clinical situations. eGFR's persistently <60 mL/min signify possible Chronic Kidney Disease.   . Anion gap 12/21/2015 11  5 - 15 Final  . WBC 12/21/2015 11.9* 4.0 - 10.5 K/uL Final  . RBC 12/21/2015 4.94  3.87 - 5.11 MIL/uL Final  . Hemoglobin 12/21/2015 15.4* 12.0 - 15.0 g/dL Final  . HCT 12/21/2015 45.0  36.0 - 46.0 % Final  . MCV 12/21/2015 91.1  78.0 - 100.0 fL Final  . MCH 12/21/2015 31.2  26.0 - 34.0 pg Final  . MCHC 12/21/2015 34.2  30.0 - 36.0 g/dL Final  . RDW 12/21/2015 12.3  11.5 -  15.5 % Final  . Platelets 12/21/2015 167  150 - 400 K/uL Final  . Color, Urine 12/21/2015 AMBER* YELLOW Final   BIOCHEMICALS MAY BE AFFECTED BY COLOR  . APPearance 12/21/2015 CLEAR  CLEAR Final  . Specific Gravity, Urine 12/21/2015 1.023  1.005 - 1.030 Final  . pH 12/21/2015 6.5  5.0 - 8.0 Final  . Glucose, UA 12/21/2015 NEGATIVE  NEGATIVE mg/dL Final  . Hgb urine dipstick 12/21/2015 LARGE* NEGATIVE Final  . Bilirubin Urine 12/21/2015 NEGATIVE  NEGATIVE Final  . Ketones, ur 12/21/2015 NEGATIVE  NEGATIVE mg/dL Final  . Protein, ur 12/21/2015 30* NEGATIVE mg/dL Final  . Nitrite 12/21/2015 NEGATIVE  NEGATIVE Final  . Leukocytes, UA 12/21/2015 TRACE* NEGATIVE Final  . Troponin I 12/21/2015 0.05* <0.03 ng/mL Final   Comment: CRITICAL RESULT CALLED TO, READ BACK BY AND VERIFIED WITH: M.BEAL,RN 12/21/15 @1836  BY V.WILKINS   . Neutrophils Relative % 12/21/2015 91  % Final  . Neutro Abs 12/21/2015 10.6* 1.7 - 7.7 K/uL Final  . Lymphocytes Relative 12/21/2015 5  % Final  . Lymphs Abs 12/21/2015 0.6* 0.7 - 4.0 K/uL Final  . Monocytes Relative 12/21/2015 4  % Final  . Monocytes Absolute 12/21/2015 0.5  0.1 - 1.0 K/uL Final  . Eosinophils Relative 12/21/2015 0  % Final  . Eosinophils Absolute 12/21/2015 0.0  0.0 - 0.7 K/uL Final  . Basophils Relative 12/21/2015 0  % Final  . Basophils Absolute 12/21/2015 0.0  0.0 - 0.1 K/uL Final  . Squamous Epithelial / LPF 12/21/2015 NONE SEEN  NONE SEEN Final  . WBC, UA 12/21/2015 0-5  0 - 5 WBC/hpf Final  . RBC / HPF 12/21/2015 0-5  0 - 5 RBC/hpf Final  . Bacteria, UA 12/21/2015 RARE* NONE SEEN Final  . Troponin I 12/21/2015 <0.03  <0.03 ng/mL Final    No results found.   Assessment/Plan   ICD-10-CM   1. Tick bite, initial encounter W57.XXXA doxycycline (VIBRAMYCIN) 50 MG capsule  2. B12 deficiency E53.8 CBC with Differential/Platelets    Vitamin B12  3. Syncope, unspecified syncope type R55 CMP with eGFR  4. Slow transit constipation K59.01   5.  Alzheimer's dementia without behavioral disturbance, unspecified timing of dementia onset G30.9 CMP with eGFR   F02.80 Amb Referral to Palliative Care  6. Urinary incontinence, unspecified type R32 Urinalysis with Reflex Microscopic  7. Weight loss R63.4 CMP with eGFR    TSH    Amb Referral to Palliative Care   Tick removed from left posterior arm (including head) without complication. Area cleaned  Take doxycycline 2 caps 2 times daily x 10 days  Continue other medications as ordered  will call with lab results  Will call with palliative care referral  Follow up in 6 mos for dementia and weight loss  Ashton Belote S. Perlie Gold  [facility] [address] [place], [place] [address] [phone] Cell (Monday-Friday 8 AM - 5 PM) [phone] After 5 PM and follow prompts

## 2016-08-10 NOTE — Addendum Note (Signed)
Addended by: Chriss DriverLANE, Ajdin Macke L on: 08/10/2016 11:47 AM   Modules accepted: Orders

## 2016-08-11 LAB — COMPLETE METABOLIC PANEL WITH GFR
ALBUMIN: 3.6 g/dL (ref 3.6–5.1)
ALK PHOS: 64 U/L (ref 33–130)
ALT: 6 U/L (ref 6–29)
AST: 12 U/L (ref 10–35)
BILIRUBIN TOTAL: 0.6 mg/dL (ref 0.2–1.2)
BUN: 18 mg/dL (ref 7–25)
CALCIUM: 8.8 mg/dL (ref 8.6–10.4)
CHLORIDE: 102 mmol/L (ref 98–110)
CO2: 26 mmol/L (ref 20–31)
CREATININE: 0.65 mg/dL (ref 0.60–0.88)
GFR, Est Non African American: 79 mL/min (ref >=60)
Glucose, Bld: 85 mg/dL (ref 65–99)
Potassium: 4.3 mmol/L (ref 3.5–5.3)
Sodium: 136 mmol/L (ref 135–146)
TOTAL PROTEIN: 6.7 g/dL (ref 6.1–8.1)

## 2016-08-11 LAB — VITAMIN B12: VITAMIN B 12: 1713 pg/mL — AB (ref 200–1100)

## 2016-08-11 LAB — TSH: TSH: 1.61 mIU/L

## 2016-08-11 LAB — URINE CULTURE: Organism ID, Bacteria: NO GROWTH

## 2017-02-02 ENCOUNTER — Encounter: Payer: Self-pay | Admitting: Internal Medicine

## 2017-02-02 ENCOUNTER — Ambulatory Visit (INDEPENDENT_AMBULATORY_CARE_PROVIDER_SITE_OTHER): Payer: Medicare Other | Admitting: Internal Medicine

## 2017-02-02 VITALS — BP 118/70 | HR 72 | Temp 97.3°F | Ht 63.0 in | Wt 121.8 lb

## 2017-02-02 DIAGNOSIS — G309 Alzheimer's disease, unspecified: Secondary | ICD-10-CM

## 2017-02-02 DIAGNOSIS — R634 Abnormal weight loss: Secondary | ICD-10-CM

## 2017-02-02 DIAGNOSIS — Z23 Encounter for immunization: Secondary | ICD-10-CM | POA: Diagnosis not present

## 2017-02-02 DIAGNOSIS — F0280 Dementia in other diseases classified elsewhere without behavioral disturbance: Secondary | ICD-10-CM | POA: Diagnosis not present

## 2017-02-02 NOTE — Progress Notes (Signed)
Patient ID: Norma Daugherty, female   DOB: 1925-09-07, 83 y.o.   MRN: [id number]   [facility]  Provider: DR Elmon KirschnerMONICA S Yee Joss  Code Status: DNR Goals of Care:  Advanced Directives 08/10/2016  Does Patient Have a Medical Advance Directive? Yes  Type of Advance Directive Healthcare Power of Attorney  Does patient want to make changes to medical advance directive? -  Copy of Healthcare Power of Attorney in Chart? Yes     Chief Complaint  Patient presents with  . Medical Management of Chronic Issues    6 Months Routine Visit- Dementia, Weight Loss, Here with Husband and Lupita LeashDonna caregiver  . Flu Vaccine    Requested    HPI: Patient is a 83 y.o. female seen today for medical management of chronic diseases.  SHE IS ON HOSPICE. No change in appetite. She has lost 13 lbs since June 2018. Sleeps well. No wandering. No behavioral changes. Speech is unintelligible most times. She is a poor historian due to dementia. Hx obtained from chart.  Alzheimer's dementia - end stage. Spouse stopped namzaric as he did not feel it was helping. She has had dx of dementia x 10 yrs. Tried herbal supplements in past.  Appetite is excellent. Sleeps well. She gets "locked into her room" at night. Weight down 13 lbs since June 2018. Requires more assistance with ADLs.  Constipation - controlled.   Gastric ulcer/NSAID induced gastritis - stable. She had a gastric bleeding ulcer in Nov 2016 that stopped on its own. She had EGD showing gastric ulcer that did not show signs of rebleed. tx with BID PPI x 8 weeks. She no longer takes daily PPI.   Frequent syncopal episodes - markedly improved since family stopped STEM cell supplements and other supplements. Episodes are rare.  Osteoporosis - takes supplements  Temporal arteritis hx - no sx's since 2010. tx with prednisone  B12 deficiency - resolved on B12 supplements. B12 level 1713  Past Medical History:  Diagnosis Date  . Alzheimer disease   .  Alzheimer's dementia   . Constipation   . External hemorrhoids 2015  . Low blood pressure   . Osteoporosis   . Stomach ulcer   . Temporal arteritis [facility](H[facility])     Past Surgical History:  Procedure Laterality Date  . ESOPHAGOGASTRODUODENOSCOPY Left 12/26/2014   Procedure: ESOPHAGOGASTRODUODENOSCOPY (EGD);  Surgeon: Willis ModenaWilliam Outlaw, MD;  [facility];  Service: Endoscopy;  Laterality: Left;     reports that  has never smoked. she has never used smokeless tobacco. She reports that she does not drink alcohol or use drugs. Social History   Socioeconomic History  . Marital status: Widowed    Spouse name: Not on file  . Number of children: Not on file  . Years of education: Not on file  . Highest education level: Not on file  Social Needs  . Financial resource strain: Not on file  . Food insecurity - worry: Not on file  . Food insecurity - inability: Not on file  . Transportation needs - medical: Not on file  . Transportation needs - non-medical: Not on file  Occupational History  . Not on file  Tobacco Use  . Smoking status: Never Smoker  . Smokeless tobacco: Never Used  Substance and Sexual Activity  . Alcohol use: No    Alcohol/week: 0.0 oz  . Drug use: No  . Sexual activity: Not on file  Other Topics Concern  . Not on file  Social History Narrative  Diet:       Do you drink/ eat things with caffeine?Yes      Marital status: Married                           What year were you married ? 2003      Do you live in a house, apartment,assistred living, condo, trailer, etc.)? House       Is it one or more stories? One      How many persons live in your home ? 2      Do you have any pets in your home ?(please list) 1 Dog      Current or past profession:Office Manager        Do you exercise?    No                         Type & how often:       Do you have a living will? Yes      Do you have a DNR form?     ?                  If not, do you want to discuss one?  Yes       Do you have signed POA?HPOA forms? Yes                If so, please bring to your        appointment       History reviewed. No pertinent family history.  Allergies  Allergen Reactions  . Morphine And Related Nausea And Vomiting    Outpatient Encounter Medications as of 02/02/2017  Medication Sig  . Nutritional Supplements (OSTEO ADVANCE PO) Take 30 mLs by mouth daily.   Ailene Ards. Omega 3-6-9 CAPS Take 2 capsules by mouth daily.   Marland Kitchen. omeprazole (PRILOSEC) 40 MG capsule Take 40 mg by mouth daily.  Marland Kitchen. OVER THE COUNTER MEDICATION Take 5 mLs by mouth daily. Alpha Mineral Support  . polyethylene glycol (MIRALAX / GLYCOLAX) packet dissolve 1 packet (DISSOLVED IN WATER )AND DRINK DAILY AS NEEDED FOR CONSTIPATION  . psyllium (METAMUCIL) 58.6 % powder Take 1 packet by mouth daily.   . [DISCONTINUED] doxycycline (VIBRAMYCIN) 50 MG capsule Take 2 capsules (100 mg total) by mouth 2 (two) times daily. (Patient not taking: Reported on 02/02/2017)   No facility-administered encounter medications on file as of 02/02/2017.     Review of Systems:  Review of Systems  Unable to perform ROS: Dementia    Health Maintenance  Topic Date Due  . TETANUS/TDAP  05/16/1944  . PNA vac Low Risk Adult (2 of 2 - PCV13) 12/27/2015  . INFLUENZA VACCINE  09/15/2016  . DEXA SCAN  Completed    Physical Exam: Vitals:   02/02/17 0942  BP: 118/70  Pulse: 72  Temp: (!) 97.3 F (36.3 C)  TempSrc: Oral  SpO2: 97%  Weight: 121 lb 12.8 oz (55.2 kg)  Height: 5\' 3"  (1.6 m)   Body mass index is 21.58 kg/m. Physical Exam  Constitutional: She appears well-developed.  Frail appearing in NAD, sitting in w/c  HENT:  Mouth/Throat: Oropharynx is clear and moist. No oropharyngeal exudate.  MMM; no oral thrush  Eyes: Pupils are equal, round, and reactive to light. No scleral icterus.  Neck: Neck supple. Carotid bruit is not present. No tracheal deviation present. No thyromegaly present.  Cardiovascular: Normal  rate, regular rhythm and intact distal pulses. Exam reveals no gallop and no friction rub.  Murmur (1/6 SEM) heard. No LE edema b/l. no calf TTP.   Pulmonary/Chest: Effort normal and breath sounds normal. No stridor. No respiratory distress. She has no wheezes. She has no rales.  Abdominal: Soft. Normal appearance and bowel sounds are normal. She exhibits no distension and no mass. There is no hepatomegaly. There is no tenderness. There is no rigidity, no rebound and no guarding. No hernia.  Musculoskeletal: She exhibits edema.  Lymphadenopathy:    She has no cervical adenopathy.  Neurological: She is alert. She displays tremor (resting). Gait (w/c bound) abnormal.  Skin: Skin is warm and dry. No rash noted.  Psychiatric: She has a normal mood and affect. Her behavior is normal.    Labs reviewed: Basic Metabolic Panel: Recent Labs    08/10/16 1037  NA 136  K 4.3  CL 102  CO2 26  GLUCOSE 85  BUN 18  CREATININE 0.65  CALCIUM 8.8  TSH 1.61   Liver Function Tests: Recent Labs    08/10/16 1037  AST 12  ALT 6  ALKPHOS 64  BILITOT 0.6  PROT 6.7  ALBUMIN 3.6   No results for input(s): LIPASE, AMYLASE in the last 8760 hours. No results for input(s): AMMONIA in the last 8760 hours. CBC: Recent Labs    08/10/16 1037  WBC 6.2  NEUTROABS 3,596  HGB 13.3  HCT 39.8  MCV 92.6  PLT 194   Lipid Panel: No results for input(s): CHOL, HDL, LDLCALC, TRIG, CHOLHDL, LDLDIRECT in the last 8760 hours. No results found for: HGBA1C  Procedures since last visit: No results found.  Assessment/Plan   ICD-10-CM   1. Weight loss R63.4   2. Alzheimer's dementia without behavioral disturbance, unspecified timing of dementia onset G30.9    F02.80   3. Need for immunization against influenza Z23 Flu Vaccine QUAD 6+ mos PF IM (Fluarix Quad PF)   Influenza vaccine given  Continue current medications as ordered  Continue hospice as ordered  Follow up in 6 mos for FTT, dementia, wt  loss   Franca Stakes S. Ancil Linsey  [facility] and [facility] [address] [place], [place] [address] [phone] Cell (Monday-Friday 8 AM - 5 PM) [phone] After 5 PM and follow prompts

## 2017-02-02 NOTE — Patient Instructions (Addendum)
FLU SHOT GIVEN TODAY  Continue current medications as ordered  Continue hospice as ordered  Follow up in 6 mos for FTT, dementia, wt loss

## 2017-05-31 ENCOUNTER — Telehealth: Payer: Self-pay | Admitting: *Deleted

## 2017-05-31 NOTE — Telephone Encounter (Signed)
Keisha with Hospice called and left message on Clinical intake requesting verbal orders to continue Hospice Care.   Tried calling and spoke with operator she will LM with Nurse to return call.

## 2017-05-31 NOTE — Telephone Encounter (Signed)
[facility] with Hospice [facility] back [facility]  [facility]. Verbal order given.

## 2017-08-03 ENCOUNTER — Ambulatory Visit (INDEPENDENT_AMBULATORY_CARE_PROVIDER_SITE_OTHER): Payer: Medicare Other | Admitting: Internal Medicine

## 2017-08-03 ENCOUNTER — Encounter: Payer: Self-pay | Admitting: Internal Medicine

## 2017-08-03 VITALS — BP 132/76 | HR 103 | Temp 97.7°F | Ht 63.0 in | Wt 106.4 lb

## 2017-08-03 DIAGNOSIS — R627 Adult failure to thrive: Secondary | ICD-10-CM

## 2017-08-03 DIAGNOSIS — G309 Alzheimer's disease, unspecified: Secondary | ICD-10-CM

## 2017-08-03 DIAGNOSIS — R634 Abnormal weight loss: Secondary | ICD-10-CM

## 2017-08-03 DIAGNOSIS — F0280 Dementia in other diseases classified elsewhere without behavioral disturbance: Secondary | ICD-10-CM | POA: Diagnosis not present

## 2017-08-03 DIAGNOSIS — K2950 Unspecified chronic gastritis without bleeding: Secondary | ICD-10-CM

## 2017-08-03 DIAGNOSIS — K5901 Slow transit constipation: Secondary | ICD-10-CM | POA: Diagnosis not present

## 2017-08-03 NOTE — Progress Notes (Signed)
Patient ID: Norma Daugherty, female   DOB: November 19, 1925, 84 y.o.   MRN: [id number]   Location:  [facility] OFFICE  Provider: DR Elmon Kirschner  Code Status: DNR (hospice pt) Goals of Care:  Advanced Directives 08/10/2016  Does Patient Have a Medical Advance Directive? Yes  Type of Advance Directive Healthcare Power of Attorney  Does patient want to make changes to medical advance directive? -  Copy of Healthcare Power of Attorney in Chart? Yes     Chief Complaint  Patient presents with  . Medical Management of Chronic Issues    Husband and care giver here today with the patient. Follow Up. No refills needed. Weight loss is a concern today.    HPI: Patient is a 84 y.o. female seen today for medical management of chronic diseases.  She has an excellent appetite but has lost 15 lbs since last ov. She fell off toilet 3 times and spouse built her a "throne" and no further issues. She is a poor historian due to dementia. Hx obtained from spouse and caregiver.  Alzheimer's dementia - end stage. Spouse stopped namzaric as he did not feel it was helping. She has had dx of dementia x >10 yrs. Tried herbal supplements in past.  Appetite is excellent. Sleeps well. She gets "locked into her room" at night. Weight down 28 lbs since June 2018. Requires more assistance with ADLs. Speech is unintelligible most times now. She has visual hallucinations that do not appear threatening. She is followed by hospice  Constipation - controlled with miralax and metamucil.   Gastric ulcer/NSAID induced gastritis - stable on daily omeprazole. She had a gastric bleeding ulcer in Nov 2016 that stopped on its own. She had EGD showing gastric ulcer that did not show signs of rebleed. tx with BID PPI x 8 weeks.   Frequent syncopal episodes - stable off STEM cell supplements and other supplements. Episodes are rare.  Osteoporosis - takes supplements  Hx Temporal arteritis - no sx's since 2010. tx with prednisone   Past  Medical History:  Diagnosis Date  . Alzheimer disease   . Alzheimer's dementia   . Constipation   . External hemorrhoids 2015  . Low blood pressure   . Osteoporosis   . Stomach ulcer   . Temporal arteritis [facility])     Past Surgical History:  Procedure Laterality Date  . ESOPHAGOGASTRODUODENOSCOPY Left 12/26/2014   Procedure: ESOPHAGOGASTRODUODENOSCOPY (EGD);  Surgeon: Willis Modena, MD;  Location: [facility] ENDOSCOPY;  Service: Endoscopy;  Laterality: Left;     reports that she has never smoked. She has never used smokeless tobacco. She reports that she does not drink alcohol or use drugs. Social History   Socioeconomic History  . Marital status: Widowed    Spouse name: Not on file  . Number of children: Not on file  . Years of education: Not on file  . Highest education level: Not on file  Occupational History  . Not on file  Social Needs  . Financial resource strain: Not on file  . Food insecurity:    Worry: Not on file    Inability: Not on file  . Transportation needs:    Medical: Not on file    Non-medical: Not on file  Tobacco Use  . Smoking status: Never Smoker  . Smokeless tobacco: Never Used  Substance and Sexual Activity  . Alcohol use: No    Alcohol/week: 0.0 oz  . Drug use: No  . Sexual activity: Not on file  Lifestyle  . Physical activity:    Days per week: Not on file    Minutes per session: Not on file  . Stress: Not on file  Relationships  . Social connections:    Talks on phone: Not on file    Gets together: Not on file    Attends religious service: Not on file    Active member of club or organization: Not on file    Attends meetings of clubs or organizations: Not on file    Relationship status: Not on file  . Intimate partner violence:    Fear of current or ex partner: Not on file    Emotionally abused: Not on file    Physically abused: Not on file    Forced sexual activity: Not on file  Other Topics Concern  . Not on file  Social History  Narrative   Diet:       Do you drink/ eat things with caffeine?Yes      Marital status: Married                           What year were you married ? 2003      Do you live in a house, apartment,assistred living, condo, trailer, etc.)? House       Is it one or more stories? One      How many persons live in your home ? 2      Do you have any pets in your home ?(please list) 1 Dog      Current or past profession:Office Manager        Do you exercise?    No                         Type & how often:       Do you have a living will? Yes      Do you have a DNR form?     ?                  If not, do you want to discuss one? Yes       Do you have signed POA?HPOA forms? Yes                If so, please bring to your        appointment       History reviewed. No pertinent family history.  Allergies  Allergen Reactions  . Morphine And Related Nausea And Vomiting    Outpatient Encounter Medications as of 08/03/2017  Medication Sig  . Misc Natural Products (OSTEO BI-FLEX JOINT SHIELD PO) Take by mouth. 2 tablespoons daily  . polyethylene glycol (MIRALAX / GLYCOLAX) packet dissolve 1 packet (DISSOLVED IN WATER )AND DRINK DAILY AS NEEDED FOR CONSTIPATION  . Nutritional Supplements (OSTEO ADVANCE PO) Take 30 mLs by mouth daily.   Ailene Ards. Omega 3-6-9 CAPS Take 2 capsules by mouth daily.   Marland Kitchen. omeprazole (PRILOSEC) 40 MG capsule Take 40 mg by mouth daily.  Marland Kitchen. OVER THE COUNTER MEDICATION Take 5 mLs by mouth daily. Alpha Mineral Support  . psyllium (METAMUCIL) 58.6 % powder Take 1 packet by mouth daily.    No facility-administered encounter medications on file as of 08/03/2017.     Review of Systems:  Review of Systems  Unable to perform ROS: Dementia    Health Maintenance  Topic Date Due  . PNA vac Low  Risk Adult (2 of 2 - PCV13) 12/27/2015  . TETANUS/TDAP  02/02/2018 (Originally 05/16/1944)  . INFLUENZA VACCINE  09/15/2017  . DEXA SCAN  Completed    Physical Exam: Vitals:    08/03/17 0929  BP: 132/76  Pulse: (!) 103  Temp: 97.7 F (36.5 C)  SpO2: (!) 87%  Weight: 106 lb 6.4 oz (48.3 kg)  Height: 5\' 3"  (1.6 m)   Body mass index is 18.85 kg/m. Physical Exam  Constitutional: She appears well-developed. She appears lethargic.  frail appearing in NAD  HENT:  Mouth/Throat: Oropharynx is clear and moist. No oropharyngeal exudate.  MMM; no oral thrush  Eyes: Pupils are equal, round, and reactive to light. No scleral icterus.  Neck: Neck supple. Carotid bruit is not present. No tracheal deviation present. No thyromegaly present.  Cardiovascular: Regular rhythm and intact distal pulses. Tachycardia present. Exam reveals no gallop and no friction rub.  Murmur (1/6 SEM) heard. No LE edema b/l. no calf TTP.   Pulmonary/Chest: Effort normal and breath sounds normal. No stridor. No respiratory distress. She has no wheezes. She has no rales.  Abdominal: Soft. Normal appearance and bowel sounds are normal. She exhibits no distension. There is no tenderness. There is no rigidity, no rebound and no guarding. No hernia.  Musculoskeletal: She exhibits edema.  Lymphadenopathy:    She has no cervical adenopathy.  Neurological: She has normal reflexes. She appears lethargic.  Skin: Skin is warm and dry. No rash noted.  Psychiatric: She has a normal mood and affect. Her behavior is normal. Judgment normal. She is actively hallucinating (active visual). Thought content is delusional. Cognition and memory are impaired.  Speech unintelligible most times    Labs reviewed: Basic Metabolic Panel: Recent Labs    08/10/16 1037  NA 136  K 4.3  CL 102  CO2 26  GLUCOSE 85  BUN 18  CREATININE 0.65  CALCIUM 8.8  TSH 1.61   Liver Function Tests: Recent Labs    08/10/16 1037  AST 12  ALT 6  ALKPHOS 64  BILITOT 0.6  PROT 6.7  ALBUMIN 3.6   No results for input(s): LIPASE, AMYLASE in the last 8760 hours. No results for input(s): AMMONIA in the last 8760  hours. CBC: Recent Labs    08/10/16 1037  WBC 6.2  NEUTROABS 3,596  HGB 13.3  HCT 39.8  MCV 92.6  PLT 194   Lipid Panel: No results for input(s): CHOL, HDL, LDLCALC, TRIG, CHOLHDL, LDLDIRECT in the last 8760 hours. No results found for: HGBA1C  Procedures since last visit: No results found.  Assessment/Plan   ICD-10-CM   1. FTT (failure to thrive) in adult R62.7   2. Weight loss - down 15 lbs since Dec 2018 R63.4   3. Slow transit constipation K59.01   4. Chronic gastritis without bleeding, unspecified gastritis type K29.50   5. Alzheimer's dementia without behavioral disturbance, unspecified timing of dementia onset G30.9    F02.80     Continue current medications as ordered  Continue hospice care as ordered  Will need flu vaccine at next visit  Follow up in 6 mos for dementia, wt loss, constipation  Bryttani Blew S. Ancil Linsey  [facility] [address] [place], [place] [address] [phone] Cell (Monday-Friday 8 AM - 5 PM) [phone] After 5 PM and follow prompts

## 2017-08-03 NOTE — Patient Instructions (Signed)
Continue current medications as ordered  Continue hospice care as ordered  Will need flu vaccine at next visit  Follow up in 6 mos for dementia, wt loss, constipation

## 2017-09-15 ENCOUNTER — Encounter: Payer: Self-pay | Admitting: Internal Medicine

## 2017-10-05 ENCOUNTER — Encounter: Payer: Self-pay | Admitting: Internal Medicine

## 2017-12-16 DEATH — deceased

## 2018-02-10 ENCOUNTER — Ambulatory Visit: Payer: Medicare HMO | Admitting: Internal Medicine

## 2018-02-10 ENCOUNTER — Ambulatory Visit: Payer: Medicare Other | Admitting: Nurse Practitioner

## 2018-03-18 IMAGING — CR DG CHEST 1V PORT
1 series · 1 of 1 positions shown · non-contrast
Comparison: Prior radiograph 12/24/2014.

CLINICAL DATA: Initial evaluation for acute syncope.

EXAM:
PORTABLE CHEST 1 VIEW

[AP]
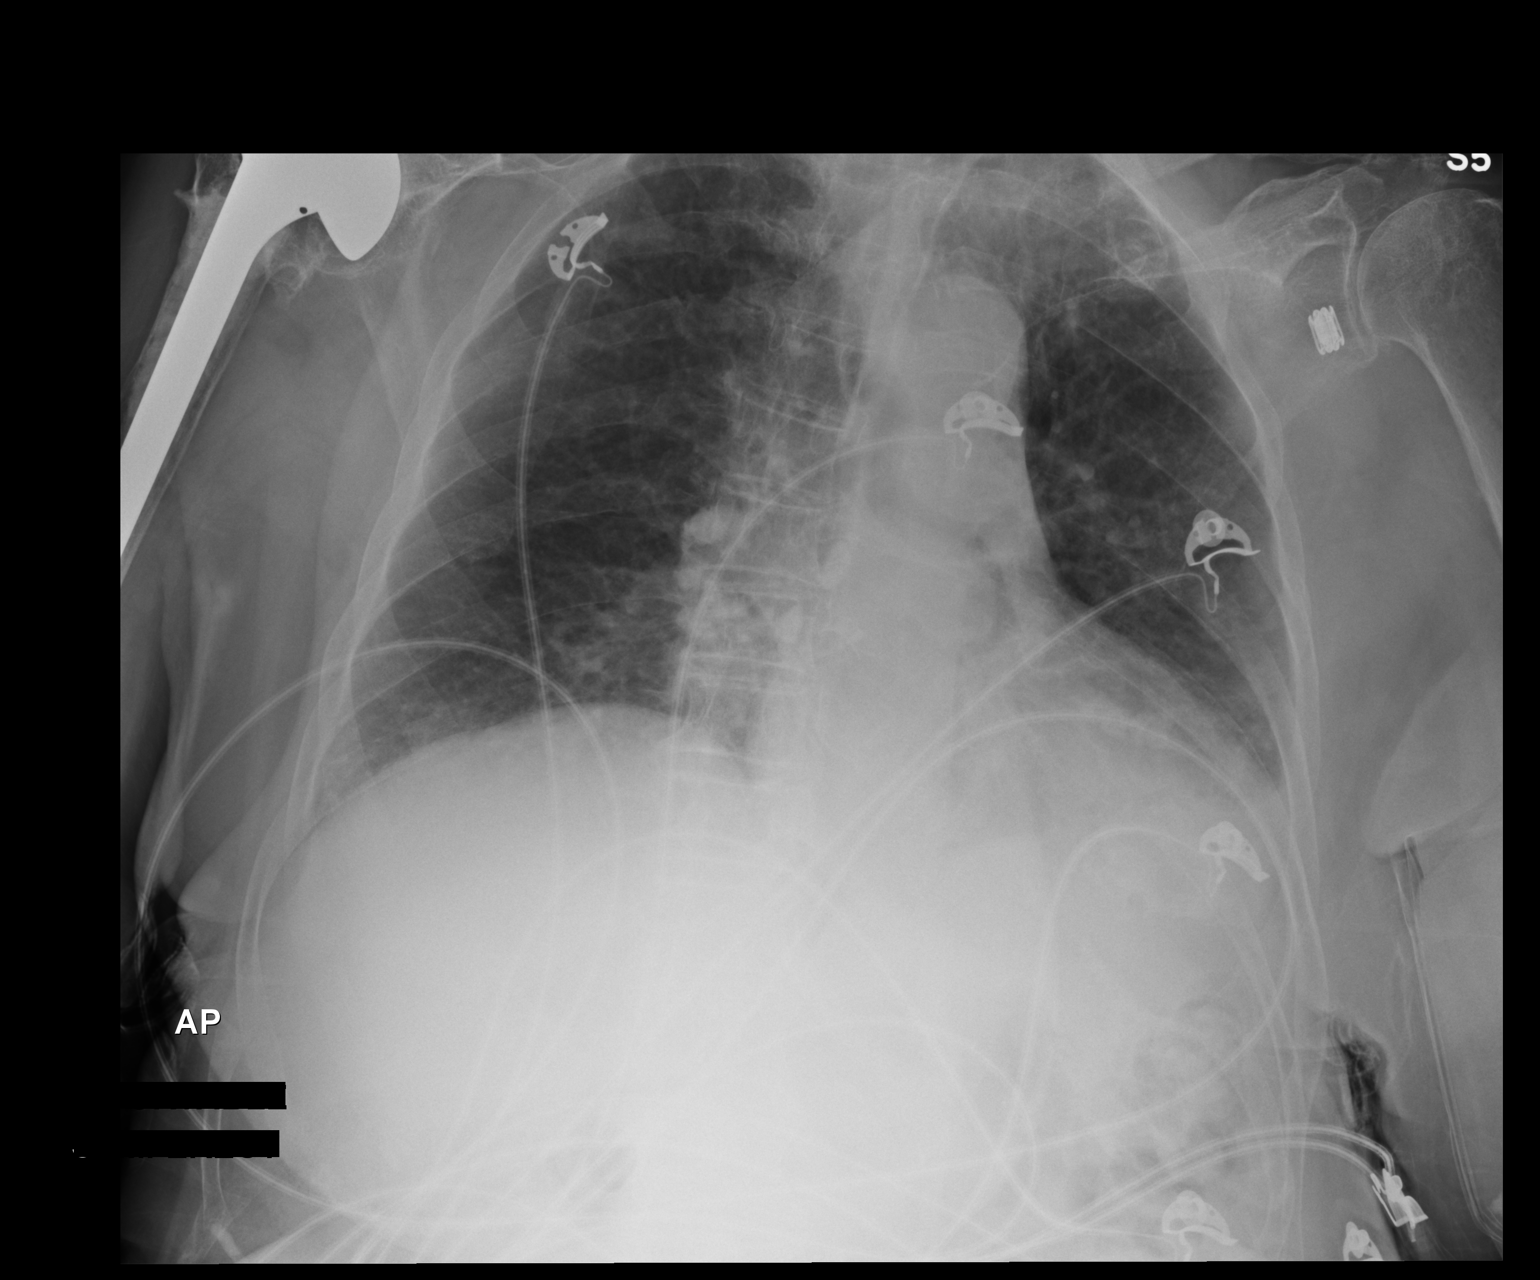

[1 of 1 positions shown; findings below may reference images not displayed]

FINDINGS: Mild cardiomegaly is stable. Mediastinal silhouette within normal
limits. Aortic atherosclerosis noted.

Lungs are mildly hypoinflated. Patchy left basilar opacity.
Additional patchy and hazy opacity at the left lung apex. Small left
pleural effusion. Right lung is clear. No pulmonary edema.

No acute osseous abnormality.  Right shoulder arthroplasty.
IMPRESSION: 1. Patchy and hazy opacities at the left lung apex and left lung
base, nonspecific, but may reflect atelectasis and/or multi focal
infiltrate/pneumonia.
2. Probable small left pleural effusion.
3. Stable cardiomegaly without pulmonary edema.
4. Aortic atherosclerosis.
